# Patient Record
Sex: Female | Born: 1950
Health system: Southern US, Community
[De-identification: ages and names within clinical notes are randomized; demographics above are authoritative.]

## PROBLEM LIST (undated history)

## (undated) DIAGNOSIS — I10 Essential (primary) hypertension: Secondary | ICD-10-CM

## (undated) DIAGNOSIS — E785 Hyperlipidemia, unspecified: Secondary | ICD-10-CM

## (undated) DIAGNOSIS — R42 Dizziness and giddiness: Secondary | ICD-10-CM

## (undated) DIAGNOSIS — C539 Malignant neoplasm of cervix uteri, unspecified: Secondary | ICD-10-CM

## (undated) HISTORY — PX: ABDOMINAL HYSTERECTOMY: SHX81

## (undated) HISTORY — DX: Dizziness and giddiness: R42

## (undated) HISTORY — DX: Malignant neoplasm of cervix uteri, unspecified: C53.9

---

## 1998-12-16 ENCOUNTER — Other Ambulatory Visit: Admission: RE | Admit: 1998-12-16 | Discharge: 1998-12-16 | Payer: Self-pay | Admitting: Obstetrics and Gynecology

## 1999-02-05 ENCOUNTER — Encounter (INDEPENDENT_AMBULATORY_CARE_PROVIDER_SITE_OTHER): Payer: Self-pay

## 1999-02-05 ENCOUNTER — Other Ambulatory Visit: Admission: RE | Admit: 1999-02-05 | Discharge: 1999-02-05 | Payer: Self-pay | Admitting: Obstetrics and Gynecology

## 1999-07-27 ENCOUNTER — Other Ambulatory Visit: Admission: RE | Admit: 1999-07-27 | Discharge: 1999-07-27 | Payer: Self-pay | Admitting: Obstetrics and Gynecology

## 2000-05-30 ENCOUNTER — Encounter: Payer: Self-pay | Admitting: Internal Medicine

## 2000-05-30 ENCOUNTER — Encounter: Admission: RE | Admit: 2000-05-30 | Discharge: 2000-05-30 | Payer: Self-pay | Admitting: Internal Medicine

## 2000-06-10 ENCOUNTER — Ambulatory Visit (HOSPITAL_COMMUNITY): Admission: RE | Admit: 2000-06-10 | Discharge: 2000-06-10 | Payer: Self-pay | Admitting: Gastroenterology

## 2000-06-23 ENCOUNTER — Other Ambulatory Visit: Admission: RE | Admit: 2000-06-23 | Discharge: 2000-06-23 | Payer: Self-pay | Admitting: Internal Medicine

## 2000-08-08 ENCOUNTER — Encounter: Admission: RE | Admit: 2000-08-08 | Discharge: 2000-08-08 | Payer: Self-pay | Admitting: Internal Medicine

## 2000-08-08 ENCOUNTER — Encounter: Payer: Self-pay | Admitting: Internal Medicine

## 2001-05-25 ENCOUNTER — Encounter: Admission: RE | Admit: 2001-05-25 | Discharge: 2001-05-25 | Payer: Self-pay | Admitting: Internal Medicine

## 2001-05-25 ENCOUNTER — Encounter: Payer: Self-pay | Admitting: Internal Medicine

## 2002-06-22 ENCOUNTER — Encounter: Admission: RE | Admit: 2002-06-22 | Discharge: 2002-06-22 | Payer: Self-pay | Admitting: Internal Medicine

## 2002-06-22 ENCOUNTER — Encounter: Payer: Self-pay | Admitting: Internal Medicine

## 2003-04-03 ENCOUNTER — Emergency Department (HOSPITAL_COMMUNITY): Admission: EM | Admit: 2003-04-03 | Discharge: 2003-04-03 | Payer: Self-pay | Admitting: Emergency Medicine

## 2003-07-08 ENCOUNTER — Encounter: Admission: RE | Admit: 2003-07-08 | Discharge: 2003-07-08 | Payer: Self-pay | Admitting: Internal Medicine

## 2003-07-15 ENCOUNTER — Encounter: Admission: RE | Admit: 2003-07-15 | Discharge: 2003-07-15 | Payer: Self-pay | Admitting: Internal Medicine

## 2004-04-14 ENCOUNTER — Encounter: Admission: RE | Admit: 2004-04-14 | Discharge: 2004-04-14 | Payer: Self-pay | Admitting: Internal Medicine

## 2004-10-20 ENCOUNTER — Encounter: Admission: RE | Admit: 2004-10-20 | Discharge: 2004-10-20 | Payer: Self-pay | Admitting: Internal Medicine

## 2005-01-21 ENCOUNTER — Encounter: Admission: RE | Admit: 2005-01-21 | Discharge: 2005-01-21 | Payer: Self-pay | Admitting: General Surgery

## 2005-12-13 ENCOUNTER — Encounter: Admission: RE | Admit: 2005-12-13 | Discharge: 2005-12-13 | Payer: Self-pay | Admitting: Internal Medicine

## 2006-01-22 IMAGING — US US ABDOMEN COMPLETE
1 series · 14 of 25 positions shown · non-contrast
Comparison: none

CLINICAL DATA: Right side abdominal pain.
ABDOMEN ULTRASOUND:
TECHNIQUE: Complete abdominal ultrasound examination was performed including evaluation of the liver, gallbladder, bile ducts, pancreas, kidneys, spleen, IVC, and abdominal aorta.

[Series 1: us abdomen complete · 0.32mm/px · 14 of 78 slices shown]
[im 1/78]
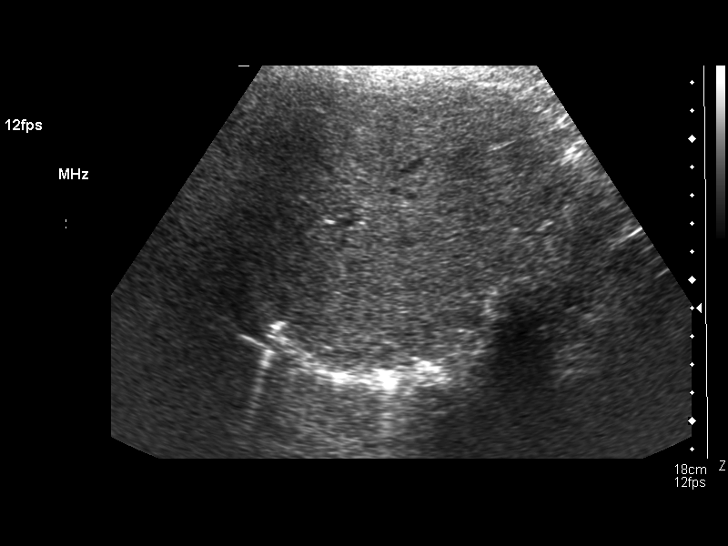
[im 7/78]
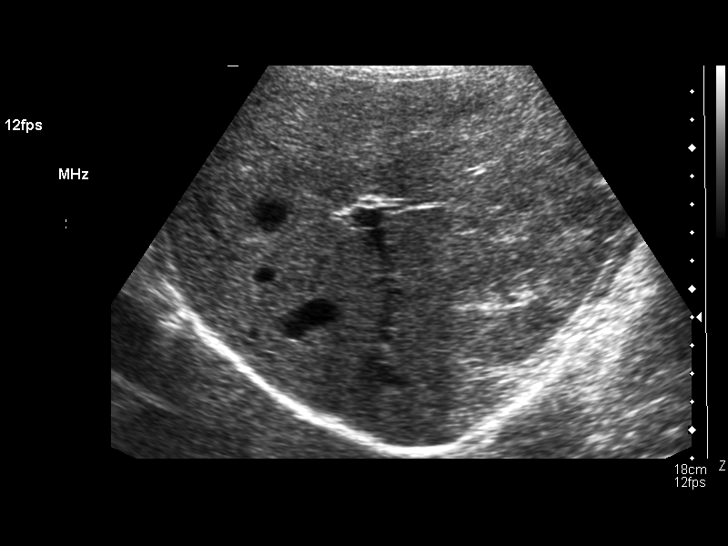
[im 13/78]
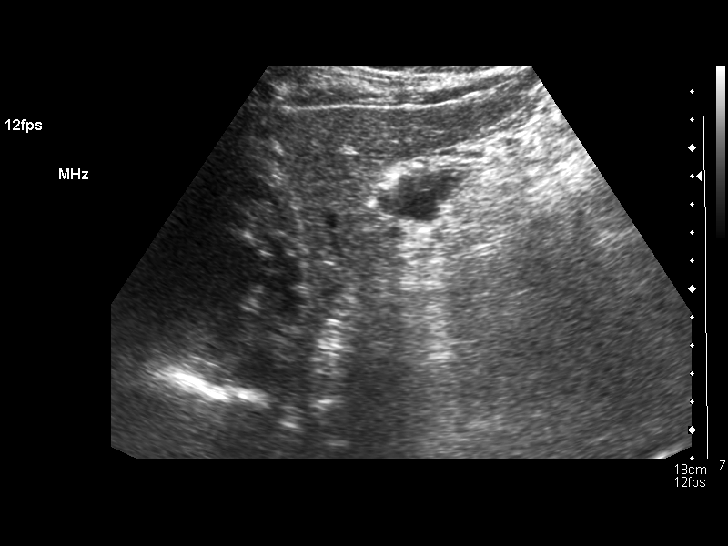
[im 20/78]
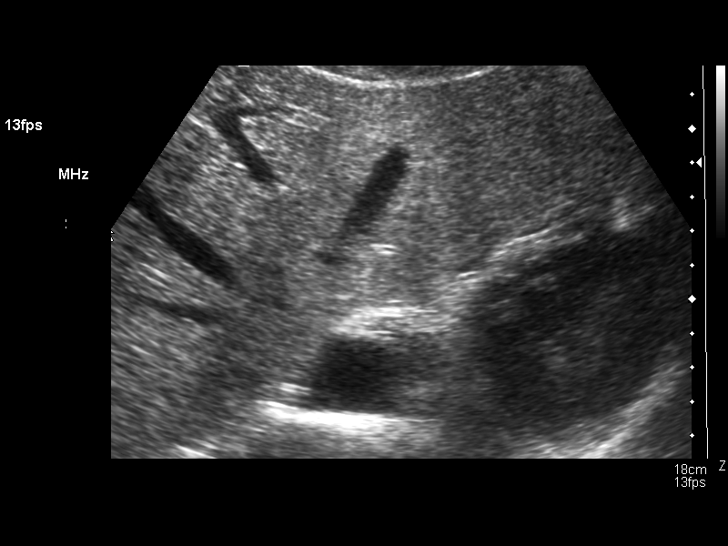
[im 26/78]
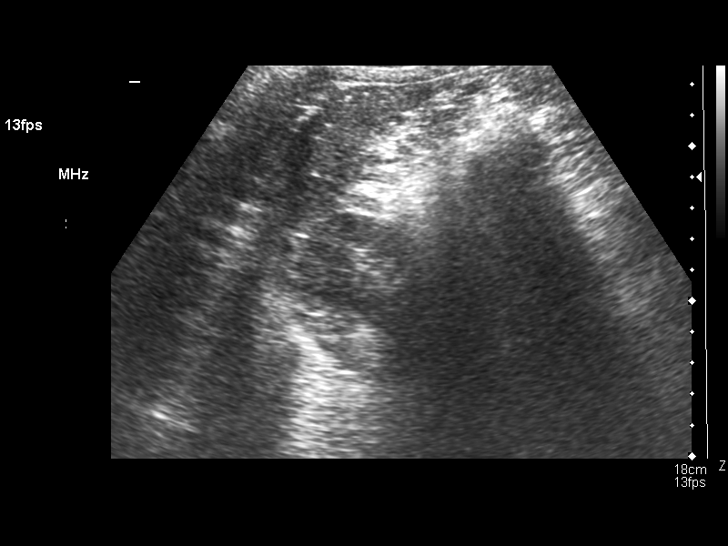
[im 29/78]
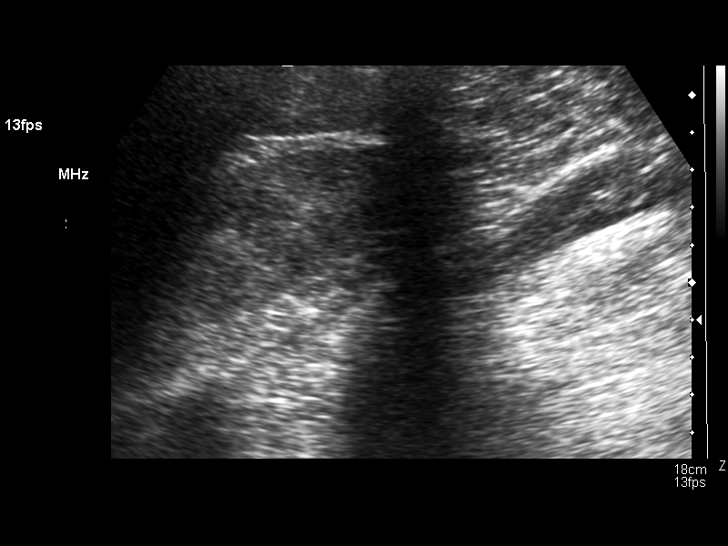
[im 36/78]
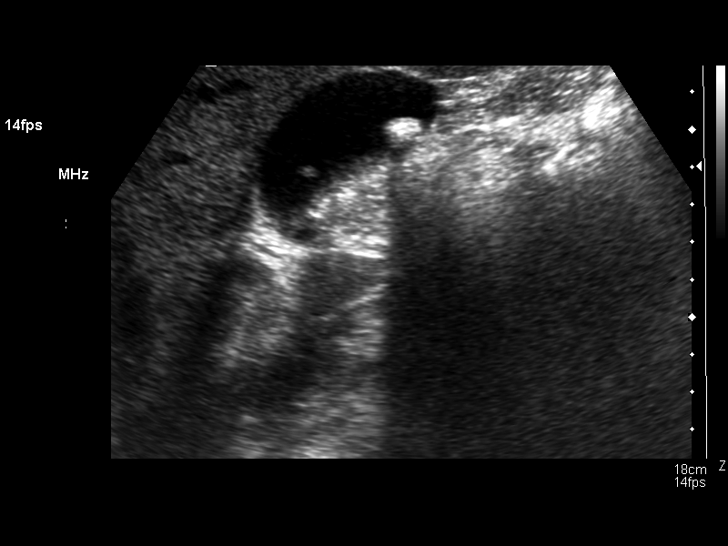
[im 42/78]
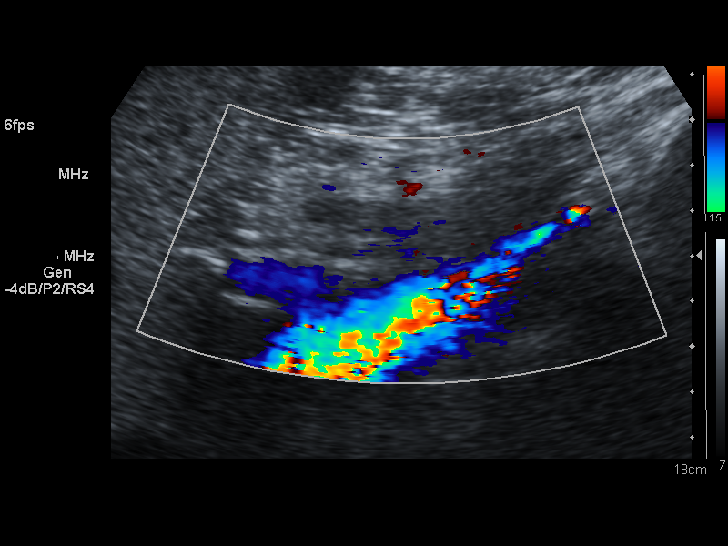
[im 49/78]
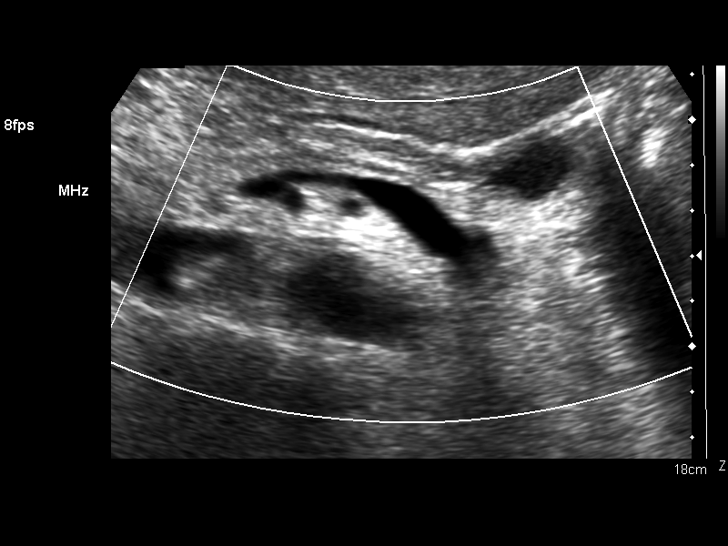
[im 52/78]
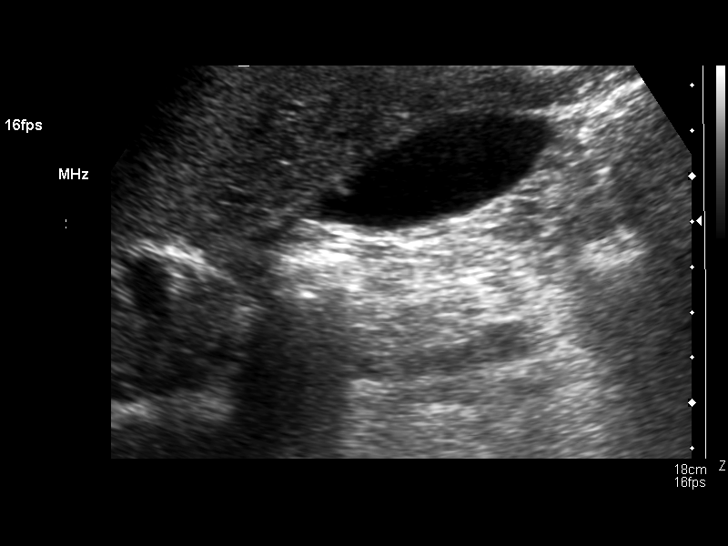
[im 58/78]
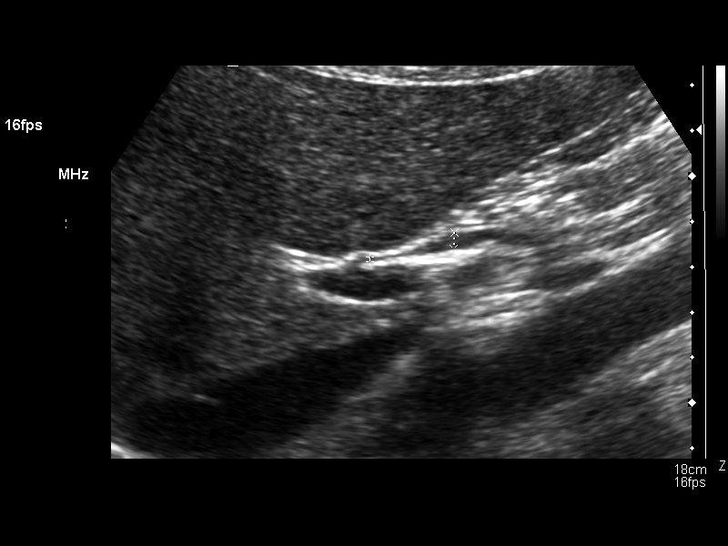
[im 65/78]
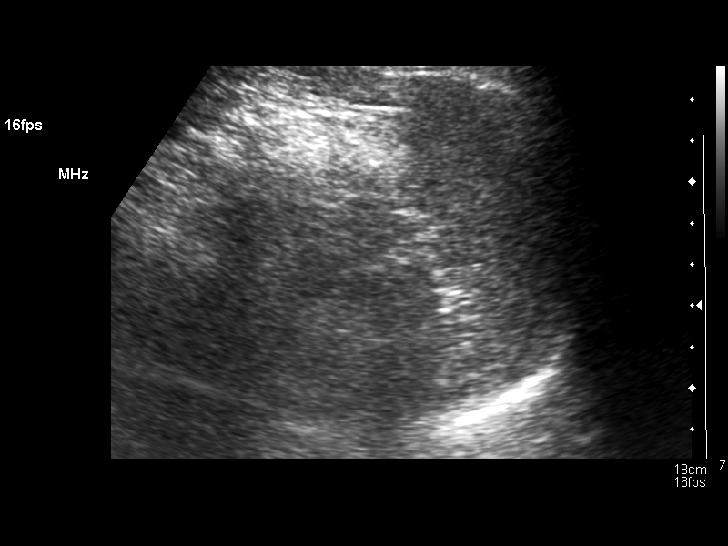
[im 71/78]
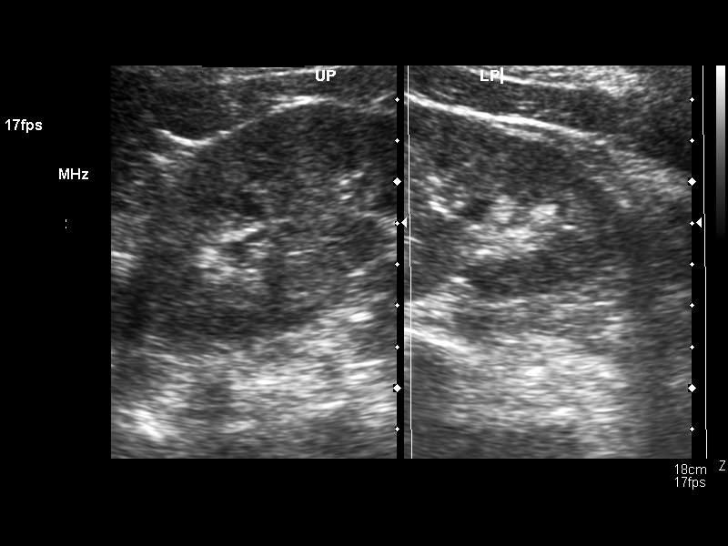
[im 78/78]
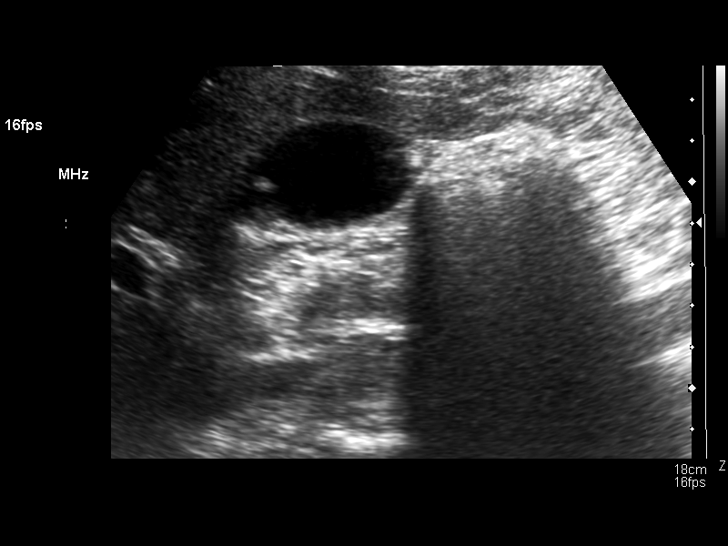

[14 of 25 positions shown; findings below may reference images not displayed]

FINDINGS: At least one gallstone is seen measuring 1 cm. Two other tiny echogenic foci are noted which are nonmobile and consistent with tiny cholesterol polyps. There is no evidence of gallbladder wall thickening. There is no evidence of biliary ductal dilatation with common bile duct measuring approximately 3 mm.
The liver is normal in echogenicity. A tiny cyst measuring 7 mm is seen in the posterior right hepatic lobe. No liver masses are identified.  Visualized portion of the IVC and pancreas are unremarkable. 
There is no evidence of splenomegaly.  Both kidneys are normal in size and appearance. There is no evidence of abdominal aortic aneurysm or ascites.
IMPRESSION: 1.  Cholelithiasis, without evidence of gallbladder wall thickening, biliary dilatation, or other acute findings
2.  Tiny 7 mm cyst  in the posterior right hepatic lobe.

## 2007-04-28 ENCOUNTER — Encounter: Admission: RE | Admit: 2007-04-28 | Discharge: 2007-04-28 | Payer: Self-pay | Admitting: Internal Medicine

## 2007-05-10 ENCOUNTER — Encounter: Admission: RE | Admit: 2007-05-10 | Discharge: 2007-05-10 | Payer: Self-pay | Admitting: Internal Medicine

## 2007-05-25 ENCOUNTER — Ambulatory Visit: Payer: Self-pay | Admitting: Family Medicine

## 2007-05-25 ENCOUNTER — Encounter: Admission: RE | Admit: 2007-05-25 | Discharge: 2007-05-25 | Payer: Self-pay | Admitting: Family Medicine

## 2007-05-26 ENCOUNTER — Encounter: Admission: RE | Admit: 2007-05-26 | Discharge: 2007-05-26 | Payer: Self-pay | Admitting: Family Medicine

## 2007-06-05 ENCOUNTER — Ambulatory Visit: Payer: Self-pay | Admitting: Family Medicine

## 2007-12-09 ENCOUNTER — Emergency Department (HOSPITAL_COMMUNITY): Admission: EM | Admit: 2007-12-09 | Discharge: 2007-12-09 | Payer: Self-pay | Admitting: Emergency Medicine

## 2007-12-10 ENCOUNTER — Inpatient Hospital Stay (HOSPITAL_COMMUNITY): Admission: EM | Admit: 2007-12-10 | Discharge: 2007-12-12 | Payer: Self-pay | Admitting: Orthopedic Surgery

## 2007-12-10 ENCOUNTER — Emergency Department (HOSPITAL_COMMUNITY): Admission: EM | Admit: 2007-12-10 | Discharge: 2007-12-10 | Payer: Self-pay | Admitting: Family Medicine

## 2008-03-04 ENCOUNTER — Ambulatory Visit: Payer: Self-pay | Admitting: Family Medicine

## 2008-03-04 ENCOUNTER — Emergency Department (HOSPITAL_COMMUNITY): Admission: EM | Admit: 2008-03-04 | Discharge: 2008-03-04 | Payer: Self-pay | Admitting: Emergency Medicine

## 2008-09-02 ENCOUNTER — Encounter: Admission: RE | Admit: 2008-09-02 | Discharge: 2008-09-02 | Payer: Self-pay | Admitting: Internal Medicine

## 2008-11-14 ENCOUNTER — Emergency Department (HOSPITAL_COMMUNITY): Admission: EM | Admit: 2008-11-14 | Discharge: 2008-11-14 | Payer: Self-pay | Admitting: Emergency Medicine

## 2008-11-17 ENCOUNTER — Emergency Department (HOSPITAL_COMMUNITY): Admission: EM | Admit: 2008-11-17 | Discharge: 2008-11-17 | Payer: Self-pay | Admitting: Family Medicine

## 2009-10-20 ENCOUNTER — Encounter: Admission: RE | Admit: 2009-10-20 | Discharge: 2009-10-20 | Payer: Self-pay | Admitting: Internal Medicine

## 2010-01-18 ENCOUNTER — Emergency Department (HOSPITAL_COMMUNITY): Admission: EM | Admit: 2010-01-18 | Discharge: 2010-01-18 | Payer: Self-pay | Admitting: Family Medicine

## 2010-04-19 ENCOUNTER — Encounter: Payer: Self-pay | Admitting: Internal Medicine

## 2010-07-04 LAB — CULTURE, ROUTINE-ABSCESS

## 2010-08-14 NOTE — Procedures (Signed)
[facility]  Patient:    Erika Mosley, Erika Mosley                      MRN: [id number] Proc. Date: 06/10/00 Adm. Date:  [id number] Attending:  Charna Elizabeth CC:         Merlene Laughter. Renae Gloss, M.D.   Procedure Report  DATE OF BIRTH:  [birth year].  PROCEDURE:  Colonoscopy.  ENDOSCOPIST:  Anselmo Rod, M.D.  INSTRUMENT USED:  Olympus video colonoscope.  INDICATION FOR PROCEDURE:  Rectal bleeding and a family history of colon cancer (in mother) in a 18 year old African-American female.  Rule out colonic polyps, masses, hemorrhoids, etc.  PREPROCEDURE PREPARATION:  Informed consent was procured from the patient. The patient was fasted for eight hours prior to the procedure and prepped with a bottle of magnesium citrate and a gallon of NuLytely the night prior to the procedure.  PREPROCEDURE PHYSICAL:  VITAL SIGNS:  The patient had stable vital signs.  NECK:  Supple.  CHEST:  Clear to auscultation.  S1, S2 regular.  ABDOMEN:  Soft with normal abdominal bowel sounds.  DESCRIPTION OF PROCEDURE:  The patient was placed in the left lateral decubitus position and sedated with 40 mg of Demerol and 4 mg of Versed intravenously.  Once the patient was adequately sedate and maintained on low-flow oxygen and continuous cardiac monitoring, the Olympus video colonoscope was advanced from the rectum to the cecum with slight difficulty secondary to residual stool in the colon.  The ileocecal valve and the appendiceal orifice were clearly visualized and photographed.  No masses, polyps, erosions, ulcerations, or masses were seen.  There were small external and internal hemorrhoids seen on anal inspection and retroflexion, respectively.  The patient tolerated the procedure well without complications. Very small lesion may have been missed secondary to some residual stool in the transverse and right colon.  IMPRESSION: 1. Small internal and external  hemorrhoid. 2. No masses or polyps seen. 3. Some residual stool in the colon.  A very small lesion may have been missed.  RECOMMENDATIONS: 1. Patient has been advised to try Anusol-HC 2.5% suppositories, to be    inserted one every night at bedtime to provide her some relief from    internal hemorrhoids.  Preparation H is to be applied externally. 2. High-fiber diet with liberal fluid intake has been advocated. 3. Outpatient follow-up is advised in the next two to four weeks. 4. If the bleeding does not stop with local steroid application, surgical    evaluation will be considered. 5. Considering the patients family history of colon cancer in a first    degree relative, repeat colorectal cancer screening has been recommended    in the next five years unless she were to develop any abnormal symptoms    in the interim. DD:  06/10/00 TD:  06/10/00 Job: [id number]

## 2010-11-02 ENCOUNTER — Other Ambulatory Visit: Payer: Self-pay | Admitting: Internal Medicine

## 2010-11-03 ENCOUNTER — Other Ambulatory Visit: Payer: Self-pay | Admitting: Family Medicine

## 2010-11-03 DIAGNOSIS — Z1231 Encounter for screening mammogram for malignant neoplasm of breast: Secondary | ICD-10-CM

## 2010-11-10 ENCOUNTER — Ambulatory Visit
Admission: RE | Admit: 2010-11-10 | Discharge: 2010-11-10 | Disposition: A | Discharge status: Home / Self Care | Payer: 59 | Source: Ambulatory Visit | Attending: Family Medicine | Admitting: Family Medicine

## 2010-11-10 DIAGNOSIS — Z1231 Encounter for screening mammogram for malignant neoplasm of breast: Secondary | ICD-10-CM

## 2010-12-30 LAB — CBC
MCHC: 33.5
RBC: 4.18

## 2010-12-30 LAB — DIFFERENTIAL
Basophils Absolute: 0
Basophils Relative: 0
Lymphocytes Relative: 28
Monocytes Relative: 7
Neutro Abs: 6.1
Neutrophils Relative %: 64

## 2010-12-30 LAB — BASIC METABOLIC PANEL
Chloride: 106
GFR calc non Af Amer: >60
Glucose, Bld: 96
Potassium: 3.4 — ABNORMAL LOW
Sodium: 140

## 2011-05-31 ENCOUNTER — Emergency Department (INDEPENDENT_AMBULATORY_CARE_PROVIDER_SITE_OTHER)
Admission: EM | Admit: 2011-05-31 | Discharge: 2011-05-31 | Disposition: A | Discharge status: Home Health | Payer: 59 | Source: Home / Self Care | Attending: Emergency Medicine | Admitting: Emergency Medicine

## 2011-05-31 ENCOUNTER — Encounter (HOSPITAL_COMMUNITY): Payer: Self-pay | Admitting: Emergency Medicine

## 2011-05-31 DIAGNOSIS — S335XXA Sprain of ligaments of lumbar spine, initial encounter: Secondary | ICD-10-CM

## 2011-05-31 DIAGNOSIS — S39012A Strain of muscle, fascia and tendon of lower back, initial encounter: Secondary | ICD-10-CM

## 2011-05-31 HISTORY — DX: Hyperlipidemia, unspecified: E78.5

## 2011-05-31 HISTORY — DX: Essential (primary) hypertension: I10

## 2011-05-31 LAB — POCT URINALYSIS DIP (DEVICE)
Glucose, UA: NEGATIVE mg/dL
Ketones, ur: NEGATIVE mg/dL
Specific Gravity, Urine: 1.015 (ref 1.005–1.030)

## 2011-05-31 MED ORDER — METHOCARBAMOL 750 MG PO TABS: 750.0000 mg | ORAL_TABLET | Freq: Four times a day (QID) | ORAL | Status: AC

## 2011-05-31 MED ORDER — DICLOFENAC SODIUM 75 MG PO TBEC: 75.0000 mg | DELAYED_RELEASE_TABLET | Freq: Two times a day (BID) | ORAL | Status: DC

## 2011-05-31 NOTE — ED Provider Notes (Signed)
History     CSN: [id number]  Arrival date & time 05/31/11  8119   First MD Initiated Contact with Patient 05/31/11 [phone]      Chief Complaint  Patient presents with  . Flank Pain    (Consider location/radiation/quality/duration/timing/severity/associated sxs/prior treatment) HPI Comments: Patient presents today with complaints of right lower back pain. Pain is in her right flank pain area and does not radiate. Pain began suddenly 2 days ago while she was carrying her grandchild. She states she bent forward to put her grandchild down and suddenly had sharp pain. She has had syncope previously over the last year while doing heavy lifting. She has been seen with previous episodes either by her primary care Dr or at another urgent care, she is uncertain and treated with steroids which did help. She thinks that she may have also had x-rays of her back done but again is uncertain.    Past Medical History  Diagnosis Date  . Hypertension   . Hyperlipidemia     Past Surgical History  Procedure Date  . Abdominal hysterectomy     History reviewed. No pertinent family history.  History  Substance Use Topics  . Smoking status: Never Smoker   . Smokeless tobacco: Not on file  . Alcohol Use: No    OB History    Grav Para Term Preterm Abortions TAB SAB Ect Mult Living                  Review of Systems  Constitutional: Negative for fever and chills.  Respiratory: Negative for cough.   Cardiovascular: Negative for chest pain.  Gastrointestinal: Negative for vomiting, abdominal pain and diarrhea.  Genitourinary: Negative for dysuria and frequency.  Musculoskeletal: Positive for back pain.  Neurological: Negative for weakness and numbness.    Allergies  Review of patient's allergies indicates no known allergies.  Home Medications   Current Outpatient Rx  Name Route Sig Dispense Refill  . ASPIRIN 81 MG PO TABS Oral Take 81 mg by mouth daily.    Marland Kitchen LISINOPRIL 5 MG PO TABS Oral  Take 5 mg by mouth daily.    Marland Kitchen SIMVASTATIN 10 MG PO TABS Oral Take 10 mg by mouth at bedtime.    Marland Kitchen DICLOFENAC SODIUM 75 MG PO TBEC Oral Take 1 tablet (75 mg total) by mouth 2 (two) times daily. 20 tablet 0  . METHOCARBAMOL 750 MG PO TABS Oral Take 1 tablet (750 mg total) by mouth 4 (four) times daily. 20 tablet 0    BP 156/75  Pulse 74  Temp(Src) 98.3 F (36.8 C) (Oral)  Resp 20  SpO2 98%  Physical Exam  Nursing note and vitals reviewed. Constitutional: She appears well-developed and well-nourished. No distress.  HENT:  Head: Normocephalic and atraumatic.  Cardiovascular: Normal rate, regular rhythm and normal heart sounds.   Pulses:      Dorsalis pedis pulses are 2+ on the right side, and 2+ on the left side.       Posterior tibial pulses are 2+ on the right side, and 2+ on the left side.       No PTE bilat  Pulmonary/Chest: Effort normal and breath sounds normal. No respiratory distress.  Abdominal: Soft. Bowel sounds are normal. She exhibits no distension and no mass. There is no tenderness.  Musculoskeletal:       Lumbar back: She exhibits decreased range of motion (decreased flexion at 45 degrees due to pain). She exhibits no tenderness, no bony tenderness, no  swelling, no deformity and no spasm.       Neg SLR bilat. Pt able to stand heels and toes.  Pain reproduced with twisting movement of torso, flexion of lumbar spine and movment on exam table.   Neurological: She has normal strength. Gait normal.  Reflex Scores:      Achilles reflexes are 2+ on the right side and 2+ on the left side. Skin: Skin is warm and dry.  Psychiatric: She has a normal mood and affect.    ED Course  Procedures (including critical care time)  Labs Reviewed  POCT URINALYSIS DIP (DEVICE) - Abnormal; Notable for the following:    Hgb urine dipstick TRACE (*)    All other components within normal limits   No results found.   1. Strain of lumbar region       MDM  Sudden onset of Rt LBP  while carrying grandchild 2 days ago and bending forward. Hx of same recurrent pain with heavy lifting x 1 yr per pt treated previously at other facilities with Prednisone.         Melody Comas, [place] 05/31/11 1211

## 2011-05-31 NOTE — ED Notes (Signed)
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

## 2011-05-31 NOTE — Discharge Instructions (Signed)
Continue ice packs 3-4 times a day for 15-20 minutes. Follow up with Dr Yehuda Budd. As we discussed, since this is a recurrent problem, she may wish to consider physical therapy evaluation for you to help strengthen your core. This will be for her to determine at your follow up evaluation. Return as needed.

## 2011-06-01 NOTE — ED Provider Notes (Signed)
Medical screening examination/treatment/procedure(s) were performed by non-physician practitioner and as supervising physician I was immediately available for consultation/collaboration.  Leslee Home, M.D.   Roque Lias, MD 06/01/11 [phone]

## 2011-09-09 ENCOUNTER — Other Ambulatory Visit: Payer: Self-pay | Admitting: Family Medicine

## 2011-09-09 DIAGNOSIS — Z1231 Encounter for screening mammogram for malignant neoplasm of breast: Secondary | ICD-10-CM

## 2011-09-14 ENCOUNTER — Ambulatory Visit
Admission: RE | Admit: 2011-09-14 | Discharge: 2011-09-14 | Disposition: A | Discharge status: Home / Self Care | Payer: Self-pay | Source: Ambulatory Visit | Attending: Family Medicine | Admitting: Family Medicine

## 2011-09-14 DIAGNOSIS — Z1231 Encounter for screening mammogram for malignant neoplasm of breast: Secondary | ICD-10-CM

## 2011-11-24 ENCOUNTER — Ambulatory Visit: Admission: RE | Admit: 2011-11-24 | Payer: 59 | Source: Ambulatory Visit

## 2011-11-24 ENCOUNTER — Ambulatory Visit
Admission: RE | Admit: 2011-11-24 | Discharge: 2011-11-24 | Disposition: A | Discharge status: Home / Self Care | Payer: 59 | Source: Ambulatory Visit | Attending: Family Medicine | Admitting: Family Medicine

## 2011-11-24 ENCOUNTER — Other Ambulatory Visit: Payer: Self-pay | Admitting: Family Medicine

## 2011-11-24 DIAGNOSIS — Z1231 Encounter for screening mammogram for malignant neoplasm of breast: Secondary | ICD-10-CM

## 2012-05-17 ENCOUNTER — Emergency Department (HOSPITAL_COMMUNITY)
Admission: EM | Admit: 2012-05-17 | Discharge: 2012-05-17 | Disposition: A | Discharge status: Home / Self Care | Payer: 59 | Attending: Emergency Medicine | Admitting: Emergency Medicine

## 2012-05-17 ENCOUNTER — Emergency Department (HOSPITAL_COMMUNITY): Payer: 59

## 2012-05-17 ENCOUNTER — Encounter (HOSPITAL_COMMUNITY): Payer: Self-pay | Admitting: Family Medicine

## 2012-05-17 DIAGNOSIS — E785 Hyperlipidemia, unspecified: Secondary | ICD-10-CM | POA: Insufficient documentation

## 2012-05-17 DIAGNOSIS — J019 Acute sinusitis, unspecified: Secondary | ICD-10-CM | POA: Insufficient documentation

## 2012-05-17 DIAGNOSIS — Z79899 Other long term (current) drug therapy: Secondary | ICD-10-CM | POA: Insufficient documentation

## 2012-05-17 DIAGNOSIS — R110 Nausea: Secondary | ICD-10-CM | POA: Insufficient documentation

## 2012-05-17 DIAGNOSIS — I10 Essential (primary) hypertension: Secondary | ICD-10-CM | POA: Insufficient documentation

## 2012-05-17 DIAGNOSIS — Z7982 Long term (current) use of aspirin: Secondary | ICD-10-CM | POA: Insufficient documentation

## 2012-05-17 LAB — POCT I-STAT, CHEM 8
BUN: 11 mg/dL (ref 6–23)
Chloride: 105 mEq/L (ref 96–112)
Sodium: 142 mEq/L (ref 135–145)

## 2012-05-17 LAB — POCT I-STAT TROPONIN I: Troponin i, poc: 0 ng/mL (ref 0.00–0.08)

## 2012-05-17 LAB — GLUCOSE, CAPILLARY: Glucose-Capillary: 82 mg/dL (ref 70–99)

## 2012-05-17 MED ORDER — MECLIZINE HCL 12.5 MG PO TABS: 12.5000 mg | ORAL_TABLET | Freq: Three times a day (TID) | ORAL | Status: DC | PRN

## 2012-05-17 MED ORDER — LEVOFLOXACIN 500 MG PO TABS: 500.0000 mg | ORAL_TABLET | Freq: Every day | ORAL | Status: DC

## 2012-05-17 MED ORDER — MECLIZINE HCL 25 MG PO TABS
25.0000 mg | ORAL_TABLET | Freq: Once | ORAL | Status: AC
  Administered 2012-05-17: 25 mg via ORAL
  Filled 2012-05-17: qty 1

## 2012-05-17 NOTE — ED Provider Notes (Addendum)
History     CSN: [id number]  Arrival date & time 05/17/12  0309   First MD Initiated Contact with Patient 05/17/12 [phone]      Chief Complaint  Patient presents with  . Dizziness  . Nausea    (Consider location/radiation/quality/duration/timing/severity/associated sxs/prior treatment) HPI Comments: PT states had vertigo x .  Resolved now.  NO pain, no headache,  Some nausea, resolved.  Staets has had hx of recurrent vertigo, and that this feels similar.  The history is provided by the patient.    Past Medical History  Diagnosis Date  . Hypertension   . Hyperlipidemia     Past Surgical History  Procedure Laterality Date  . Abdominal hysterectomy      History reviewed. No pertinent family history.  History  Substance Use Topics  . Smoking status: Never Smoker   . Smokeless tobacco: Not on file  . Alcohol Use: Yes     Comment: Occassionally - less than qmonthly    OB History   Grav Para Term Preterm Abortions TAB SAB Ect Mult Living                  Review of Systems  Gastrointestinal: Positive for nausea.  Neurological: Positive for dizziness.  All other systems reviewed and are negative.    Allergies  Review of patient's allergies indicates no known allergies.  Home Medications   Current Outpatient Rx  Name  Route  Sig  Dispense  Refill  . aspirin 81 MG tablet   Oral   Take 81 mg by mouth daily.         . diclofenac (VOLTAREN) 75 MG EC tablet   Oral   Take 1 tablet (75 mg total) by mouth 2 (two) times daily.   20 tablet   0   . lisinopril (PRINIVIL,ZESTRIL) 5 MG tablet   Oral   Take 5 mg by mouth daily.         . simvastatin (ZOCOR) 10 MG tablet   Oral   Take 10 mg by mouth at bedtime.           BP 106/47  Pulse 72  Temp(Src) 98.8 F (37.1 C) (Oral)  Resp 18  SpO2 100%  Physical Exam  Nursing note and vitals reviewed. Constitutional: She is oriented to person, place, and time. She appears well-developed and  well-nourished.  HENT:  Head: Normocephalic and atraumatic.  Eyes: Conjunctivae and EOM are normal. Pupils are equal, round, and reactive to light.  Neck: Normal range of motion.  Cardiovascular: Normal rate, regular rhythm and normal heart sounds.   Pulmonary/Chest: Effort normal and breath sounds normal.  Abdominal: Soft. Bowel sounds are normal.  Musculoskeletal: Normal range of motion.  Neurological: She is alert and oriented to person, place, and time.  Skin: Skin is warm and dry.  Psychiatric: She has a normal mood and affect. Her behavior is normal.    ED Course  Procedures (including critical care time)  Labs Reviewed - No data to display No results found.   No diagnosis found.    Date: 05/17/2012  Rate: 73  Rhythm: normal sinus rhythm  QRS Axis: normal  Intervals: normal  ST/T Wave abnormalities: normal  Conduction Disutrbances: none  Narrative Interpretation: unremarkable    MDM  Symptoms improved.  No focal deficits.  Will lab, ct, reassess    Mild sinusitis.  Will treat,  Dc to fu,  Ret new/worsening sxs    Mary-Anne Polizzi Lytle Michaels, MD 05/17/12 [phone]  Xan Sparkman Lytle Michaels, MD 05/17/12 [phone]

## 2012-05-17 NOTE — ED Notes (Signed)
Per EMS pt was at work, went on break and felt "bad", per pt has had "cold symptoms" for 2-3 days. Pt went to bathroom and became dizzy and nauseous while bearing down. Per EMs pt was diaphoretic, nauseous, and had light sensitivity on their arrival. EMS gave 4mg  Zofran IV. Per EMS pt was NSR. Pt alert and interactive at this time, continues to be light sensitive, states feels less dizzy and less nauseous.

## 2012-09-29 ENCOUNTER — Emergency Department (HOSPITAL_COMMUNITY)
Admission: EM | Admit: 2012-09-29 | Discharge: 2012-09-29 | Disposition: A | Discharge status: Home / Self Care | Payer: 59 | Source: Home / Self Care | Attending: Emergency Medicine | Admitting: Emergency Medicine

## 2012-09-29 ENCOUNTER — Emergency Department (INDEPENDENT_AMBULATORY_CARE_PROVIDER_SITE_OTHER): Payer: 59

## 2012-09-29 ENCOUNTER — Encounter (HOSPITAL_COMMUNITY): Payer: Self-pay | Admitting: *Deleted

## 2012-09-29 DIAGNOSIS — M549 Dorsalgia, unspecified: Secondary | ICD-10-CM

## 2012-09-29 DIAGNOSIS — R51 Headache: Secondary | ICD-10-CM

## 2012-09-29 LAB — CBC WITH DIFFERENTIAL/PLATELET
Basophils Relative: 0 % (ref 0–1)
Eosinophils Absolute: 0 10*3/uL (ref 0.0–0.7)
HCT: 39.7 % (ref 36.0–46.0)
Hemoglobin: 13.1 g/dL (ref 12.0–15.0)
Lymphs Abs: 2.1 10*3/uL (ref 0.7–4.0)
MCH: 30 pg (ref 26.0–34.0)
MCHC: 33 g/dL (ref 30.0–36.0)
MCV: 90.8 fL (ref 78.0–100.0)
Monocytes Absolute: 1.1 10*3/uL — ABNORMAL HIGH (ref 0.1–1.0)
Monocytes Relative: 9 % (ref 3–12)
Neutrophils Relative %: 75 % (ref 43–77)
RBC: 4.37 MIL/uL (ref 3.87–5.11)

## 2012-09-29 LAB — POCT I-STAT, CHEM 8
Creatinine, Ser: 0.8 mg/dL (ref 0.50–1.10)
Glucose, Bld: 103 mg/dL — ABNORMAL HIGH (ref 70–99)
HCT: 42 % (ref 36.0–46.0)
Hemoglobin: 14.3 g/dL (ref 12.0–15.0)
Sodium: 141 mEq/L (ref 135–145)
TCO2: 26 mmol/L (ref 0–100)

## 2012-09-29 LAB — POCT URINALYSIS DIP (DEVICE)
Bilirubin Urine: NEGATIVE
Glucose, UA: NEGATIVE mg/dL
Hgb urine dipstick: NEGATIVE
Ketones, ur: NEGATIVE mg/dL
Specific Gravity, Urine: 1.015 (ref 1.005–1.030)
pH: 7 (ref 5.0–8.0)

## 2012-09-29 MED ORDER — PREDNISONE 50 MG PO TABS: ORAL_TABLET | ORAL | Status: DC

## 2012-09-29 MED ORDER — BACLOFEN 10 MG PO TABS: 20.0000 mg | ORAL_TABLET | Freq: Three times a day (TID) | ORAL | Status: DC

## 2012-09-29 MED ORDER — ONDANSETRON 4 MG PO TBDP: 4.0000 mg | ORAL_TABLET | Freq: Four times a day (QID) | ORAL | Status: DC | PRN

## 2012-09-29 MED ORDER — HYDROCODONE-ACETAMINOPHEN 5-325 MG PO TABS: 1.0000 | ORAL_TABLET | ORAL | Status: DC | PRN

## 2012-09-29 NOTE — ED Notes (Addendum)
Pt  Reports      Pain r  Flank  As  Well  As   Some  Nausea   And  A  headache     No  Vomiting         denys  Any  Injury       Pt  Reports     Symptoms  Began  sev  Days     Ago           She  Reports  The  Pain in  Her  Headache is   described  As    A  Pressure    She  Is  Awake  And  Alert       She  Seems  Slightly  Restless

## 2012-09-29 NOTE — ED Provider Notes (Signed)
History    CSN: [id number] Arrival date & time 09/29/12  1431  None    Chief Complaint  Patient presents with  . Back Pain   (Consider location/radiation/quality/duration/timing/severity/associated sxs/prior Treatment) HPI Comments: 62 year old female presents complaining of flank pain that radiates around to the abdomen on the right side, ear pain, ringing in her ears, headache, nausea. This began with the pain around her right ear. She feels like the pain starts in front of her right ear and has moved up to the top of her head. She also occasionally has some shooting pain in the same distribution. She denies any blurry vision, ringing in ears. She tried taking meclizine for this because she thought it was vertigo but this did not help. About 3 days after the headache started, she started to have pain in her right flank that has gotten progressively worse and radiates around to the lower abdomen. This pain is intermittent as well. She has not taken anything for it because she states she doesn't know what she should take. She has had some nausea associated with this. She has a history of kidney stones but she does not think this feels like a kidney stone. She denies fever, hematuria, dysuria, vomiting, diarrhea.  Patient is a 62 y.o. female presenting with back pain.  Back Pain Associated symptoms: headaches   Associated symptoms: no abdominal pain, no chest pain, no dysuria, no fever, no numbness, no pelvic pain and no weakness    Past Medical History  Diagnosis Date  . Hypertension   . Hyperlipidemia    Past Surgical History  Procedure Laterality Date  . Abdominal hysterectomy     History reviewed. No pertinent family history. History  Substance Use Topics  . Smoking status: Never Smoker   . Smokeless tobacco: Not on file  . Alcohol Use: Yes     Comment: Occassionally - less than qmonthly   OB History   Grav Para Term Preterm Abortions TAB SAB Ect Mult Living                  Review of Systems  Constitutional: Negative for fever, chills, activity change and appetite change.  HENT: Positive for tinnitus. Negative for hearing loss, congestion, sore throat, mouth sores, trouble swallowing, neck pain, neck stiffness, dental problem and sinus pressure.   Eyes: Negative for visual disturbance.  Respiratory: Negative for cough and shortness of breath.   Cardiovascular: Negative for chest pain, palpitations and leg swelling.  Gastrointestinal: Positive for nausea. Negative for vomiting, abdominal pain, diarrhea, constipation, blood in stool and rectal pain.  Endocrine: Negative for polydipsia and polyuria.  Genitourinary: Positive for flank pain. Negative for dysuria, urgency, frequency, hematuria, vaginal discharge, vaginal pain and pelvic pain.  Musculoskeletal: Positive for back pain. Negative for myalgias, arthralgias and gait problem.  Skin: Negative for rash.  Neurological: Positive for headaches. Negative for dizziness, seizures, syncope, weakness, light-headedness and numbness.    Allergies  Review of patient's allergies indicates no known allergies.  Home Medications   Current Outpatient Rx  Name  Route  Sig  Dispense  Refill  . baclofen (LIORESAL) 10 MG tablet   Oral   Take 2 tablets (20 mg total) by mouth 3 (three) times daily.   30 each   0   . HYDROcodone-acetaminophen (NORCO/VICODIN) 5-325 MG per tablet   Oral   Take 1 tablet by mouth every 4 (four) hours as needed for pain.   15 tablet   0   .  levofloxacin (LEVAQUIN) 500 MG tablet   Oral   Take 1 tablet (500 mg total) by mouth daily.   7 tablet   0   . lisinopril (PRINIVIL,ZESTRIL) 5 MG tablet   Oral   Take 5 mg by mouth daily.         . meclizine (ANTIVERT) 12.5 MG tablet   Oral   Take 1 tablet (12.5 mg total) by mouth 3 (three) times daily as needed.   30 tablet   0   . ondansetron (ZOFRAN-ODT) 4 MG disintegrating tablet   Oral   Take 1 tablet (4 mg total) by mouth every 6  (six) hours as needed for nausea. PRN for nausea or vomiting   12 tablet   0   . predniSONE (DELTASONE) 50 MG tablet      1 tab by mouth daily   3 tablet   0   . simvastatin (ZOCOR) 10 MG tablet   Oral   Take 10 mg by mouth at bedtime.          BP 115/46  Pulse 93  Temp(Src) 100.1 F (37.8 C) (Oral)  Resp 16  SpO2 98% Physical Exam  Nursing note and vitals reviewed. Constitutional: She is oriented to person, place, and time. Vital signs are normal. She appears well-developed and well-nourished. No distress.  HENT:  Head: Atraumatic.  Eyes: EOM are normal. Pupils are equal, round, and reactive to light.  Fundoscopic exam:      The right eye shows no hemorrhage and no papilledema.       The left eye shows no hemorrhage and no papilledema.  Cardiovascular: Normal rate, regular rhythm and normal heart sounds.  Exam reveals no gallop and no friction rub.   No murmur heard. Pulmonary/Chest: Effort normal and breath sounds normal. No respiratory distress. She has no wheezes. She has no rales.  Abdominal: Soft. There is no hepatosplenomegaly. There is no tenderness. There is no rigidity, no rebound, no guarding, no CVA tenderness, no tenderness at McBurney's point and negative Murphy's sign.  Neurological: She is alert and oriented to person, place, and time. She has normal strength.  Skin: Skin is warm and dry. She is not diaphoretic.  Psychiatric: She has a normal mood and affect. Her behavior is normal. Judgment normal.    ED Course  Procedures (including critical care time) Labs Reviewed  CBC WITH DIFFERENTIAL - Abnormal; Notable for the following:    WBC 12.6 (*)    Neutro Abs 9.4 (*)    Monocytes Absolute 1.1 (*)    All other components within normal limits  SEDIMENTATION RATE - Abnormal; Notable for the following:    Sed Rate 26 (*)    All other components within normal limits  POCT URINALYSIS DIP (DEVICE) - Abnormal; Notable for the following:    Protein, ur 30  (*)    All other components within normal limits  POCT I-STAT, CHEM 8 - Abnormal; Notable for the following:    Glucose, Bld 103 (*)    Calcium, Ion 1.11 (*)    All other components within normal limits  URINE CULTURE   Dg Abd 1 View  09/29/2012   *RADIOLOGY REPORT*  Clinical Data: Right flank pain.  ABDOMEN - 1 VIEW  Comparison: [facility] image for abdominal CT scan dated 05/26/2007  Findings: Bowel gas pattern is normal.  No evidence of renal or ureteral calculi.  Stable phlebolith in the right side of the pelvis.  Stable bone island in the left  ilium.  IMPRESSION: Benign-appearing abdomen.   Original Report Authenticated By: Francene Boyers, M.D.   1. Back pain   2. Headache     MDM  Unclear what the exact cause of her symptoms this. The differential ranges from simple tension headache, migraine, trigeminal neuralgia, TMJ arthritis, ear infection, sinus infection. The back pain maybe musculoskeletal or may be related to another kidney stone. We'll treat her symptoms and have her followup in 2 days for a repeat check.   Meds ordered this encounter  Medications  . HYDROcodone-acetaminophen (NORCO/VICODIN) 5-325 MG per tablet    Sig: Take 1 tablet by mouth every 4 (four) hours as needed for pain.    Dispense:  15 tablet    Refill:  0  . baclofen (LIORESAL) 10 MG tablet    Sig: Take 2 tablets (20 mg total) by mouth 3 (three) times daily.    Dispense:  30 each    Refill:  0  . predniSONE (DELTASONE) 50 MG tablet    Sig: 1 tab by mouth daily    Dispense:  3 tablet    Refill:  0  . ondansetron (ZOFRAN-ODT) 4 MG disintegrating tablet    Sig: Take 1 tablet (4 mg total) by mouth every 6 (six) hours as needed for nausea. PRN for nausea or vomiting    Dispense:  12 tablet    Refill:  0     Graylon Good, PA-C 09/29/12 1826

## 2012-09-29 NOTE — ED Provider Notes (Signed)
Medical screening examination/treatment/procedure(s) were performed by non-physician practitioner and as supervising physician I was immediately available for consultation/collaboration.  Leslee Home, M.D.  Reuben Likes, MD 09/29/12 [phone]

## 2012-09-29 NOTE — Discharge Instructions (Signed)
Back Pain, Adult  Low back pain is very common. About 1 in 5 people have back pain. The cause of low back pain is rarely dangerous. The pain often gets better over time. About half of people with a sudden onset of back pain feel better in just 2 weeks. About 8 in 10 people feel better by 6 weeks.   CAUSES  Some common causes of back pain include:  · Strain of the muscles or ligaments supporting the spine.  · Wear and tear (degeneration) of the spinal discs.  · Arthritis.  · Direct injury to the back.  DIAGNOSIS  Most of the time, the direct cause of low back pain is not known. However, back pain can be treated effectively even when the exact cause of the pain is unknown. Answering your caregiver's questions about your overall health and symptoms is one of the most accurate ways to make sure the cause of your pain is not dangerous. If your caregiver needs more information, he or she may order lab work or imaging tests (X-rays or MRIs). However, even if imaging tests show changes in your back, this usually does not require surgery.  HOME CARE INSTRUCTIONS  For many people, back pain returns. Since low back pain is rarely dangerous, it is often a condition that people can learn to manage on their own.   · Remain active. It is stressful on the back to sit or stand in one place. Do not sit, drive, or stand in one place for more than 30 minutes at a time. Take short walks on level surfaces as soon as pain allows. Try to increase the length of time you walk each day.  · Do not stay in bed. Resting more than 1 or 2 days can delay your recovery.  · Do not avoid exercise or work. Your body is made to move. It is not dangerous to be active, even though your back may hurt. Your back will likely heal faster if you return to being active before your pain is gone.  · Pay attention to your body when you  bend and lift. Many people have less discomfort when lifting if they bend their knees, keep the load close to their bodies, and  avoid twisting. Often, the most comfortable positions are those that put less stress on your recovering back.  · Find a comfortable position to sleep. Use a firm mattress and lie on your side with your knees slightly bent. If you lie on your back, put a pillow under your knees.  · Only take over-the-counter or prescription medicines as directed by your caregiver. Over-the-counter medicines to reduce pain and inflammation are often the most helpful. Your caregiver may prescribe muscle relaxant drugs. These medicines help dull your pain so you can more quickly return to your normal activities and healthy exercise.  · Put ice on the injured area.  · Put ice in a plastic bag.  · Place a towel between your skin and the bag.  · Leave the ice on for 15-20 minutes, 3-4 times a day for the first 2 to 3 days. After that, ice and heat may be alternated to reduce pain and spasms.  · Ask your caregiver about trying back exercises and gentle massage. This may be of some benefit.  · Avoid feeling anxious or stressed. Stress increases muscle tension and can worsen back pain. It is important to recognize when you are anxious or stressed and learn ways to manage it. Exercise is a great option.  SEEK MEDICAL CARE IF:  · You have pain that is not relieved with rest or   medicine.  · You have pain that does not improve in 1 week.  · You have new symptoms.  · You are generally not feeling well.  SEEK IMMEDIATE MEDICAL CARE IF:   · You have pain that radiates from your back into your legs.  · You develop new bowel or bladder control problems.  · You have unusual weakness or numbness in your arms or legs.  · You develop nausea or vomiting.  · You develop abdominal pain.  · You feel faint.  Document Released: 03/15/2005 Document Revised: 09/14/2011 Document Reviewed: 08/03/2010  ExitCare® Patient Information ©2014 [employer].

## 2012-10-01 ENCOUNTER — Telehealth (HOSPITAL_COMMUNITY): Payer: Self-pay | Admitting: *Deleted

## 2012-10-01 LAB — URINE CULTURE: Colony Count: >=100000

## 2012-10-01 MED ORDER — CIPROFLOXACIN HCL 250 MG PO TABS: 250.0000 mg | ORAL_TABLET | Freq: Two times a day (BID) | ORAL | Status: DC

## 2012-10-01 NOTE — ED Notes (Signed)
Urine culture: >100,000 colonies Enterococcus, sed rate 26 H.  7/6  Almedia Balls PA e-prescribed Cipro to the [facility] on [address].  I called pt.  Pt. verified x 2 and given results.  Pt. told she needs Cipro for UTI and where to pick up Rx. Pt. instructed to tell her PCP about sed rate. Pt. wants to come by and get copy of her labs.  Pt. instructed how to do this. Pt. voiced understanding. Vassie Moselle 10/01/2012

## 2012-10-02 NOTE — ED Notes (Signed)
Pt. came in for copy of her labs for her doctor.  I printed them and put them in a brown envelope.  I gave them to [facility] and she will get pt. to sign medical release form and copy her ID. Vassie Moselle 10/02/2012

## 2012-10-05 ENCOUNTER — Encounter (HOSPITAL_COMMUNITY): Payer: Self-pay | Admitting: *Deleted

## 2012-10-05 ENCOUNTER — Emergency Department (HOSPITAL_COMMUNITY)
Admission: EM | Admit: 2012-10-05 | Discharge: 2012-10-05 | Disposition: A | Discharge status: Home / Self Care | Payer: 59 | Attending: Emergency Medicine | Admitting: Emergency Medicine

## 2012-10-05 DIAGNOSIS — J3489 Other specified disorders of nose and nasal sinuses: Secondary | ICD-10-CM | POA: Insufficient documentation

## 2012-10-05 DIAGNOSIS — I10 Essential (primary) hypertension: Secondary | ICD-10-CM | POA: Insufficient documentation

## 2012-10-05 DIAGNOSIS — Z862 Personal history of diseases of the blood and blood-forming organs and certain disorders involving the immune mechanism: Secondary | ICD-10-CM | POA: Insufficient documentation

## 2012-10-05 DIAGNOSIS — H109 Unspecified conjunctivitis: Secondary | ICD-10-CM | POA: Insufficient documentation

## 2012-10-05 DIAGNOSIS — Z8639 Personal history of other endocrine, nutritional and metabolic disease: Secondary | ICD-10-CM | POA: Insufficient documentation

## 2012-10-05 MED ORDER — TOBRAMYCIN 0.3 % OP SOLN: 1.0000 [drp] | OPHTHALMIC | Status: DC

## 2012-10-05 MED ORDER — TETRACAINE HCL 0.5 % OP SOLN
2.0000 [drp] | Freq: Once | OPHTHALMIC | Status: AC
  Administered 2012-10-05: 2 [drp] via OPHTHALMIC
  Filled 2012-10-05: qty 2

## 2012-10-05 MED ORDER — FLUORESCEIN SODIUM 1 MG OP STRP
1.0000 | ORAL_STRIP | Freq: Once | OPHTHALMIC | Status: AC
  Administered 2012-10-05: 1 via OPHTHALMIC
  Filled 2012-10-05: qty 1

## 2012-10-05 NOTE — ED Notes (Signed)
Pt presents to the ED with left eye drainage, pain and minor swelling. Pt denies any injury to eye, pt reports blurry vision.

## 2012-10-05 NOTE — ED Provider Notes (Signed)
History    CSN: [id number] Arrival date & time 10/05/12  0413  First MD Initiated Contact with Patient 10/05/12 [phone]     Chief Complaint  Patient presents with  . Eye Drainage   (Consider location/radiation/quality/duration/timing/severity/associated sxs/prior Treatment) HPI Comments: Patient is a 62 year old female with no significant past medical history presents for left eye redness and discharge with left mild eyelid swelling x 2 days. Discharge is greenish/yellow in appearance; she endorses crusting on her lashes upon waking. Patient states the symptoms have been progressing since onset without any modifying factors. She admits to associated nasal congestion and denies associated fevers, vision changes or vision loss, eye pain, ear discharge, sore throat, difficulty swallowing, and headaches.  The history is provided by the patient. No language interpreter was used.   Past Medical History  Diagnosis Date  . Hypertension   . Hyperlipidemia    Past Surgical History  Procedure Laterality Date  . Abdominal hysterectomy     History reviewed. No pertinent family history. History  Substance Use Topics  . Smoking status: Never Smoker   . Smokeless tobacco: Not on file  . Alcohol Use: Yes     Comment: Occassionally - less than qmonthly   OB History   Grav Para Term Preterm Abortions TAB SAB Ect Mult Living                 Review of Systems  Constitutional: Negative for fever.  HENT: Positive for congestion. Negative for ear pain, sore throat, rhinorrhea, drooling, trouble swallowing, neck pain, neck stiffness and ear discharge.   Eyes: Positive for discharge and redness. Negative for pain and visual disturbance.  All other systems reviewed and are negative.    Allergies  Review of patient's allergies indicates no known allergies.  Home Medications   Current Outpatient Rx  Name  Route  Sig  Dispense  Refill  . ciprofloxacin (CIPRO) 250 MG tablet   Oral   Take 1  tablet (250 mg total) by mouth every 12 (twelve) hours.   10 tablet   0   . Hyprom-Naphaz-Polysorb-Zn Sulf (CLEAR EYES COMPLETE OP)   Ophthalmic   Apply 1 drop to eye as needed (for eye irritation).         Marland Kitchen tobramycin (TOBREX) 0.3 % ophthalmic solution   Ophthalmic   Apply 1 drop to eye every 4 (four) hours. Place one drop in the affected eye every 4 hours for 7 days   5 mL   0    BP 172/98  Pulse 80  Temp(Src) 98.5 F (36.9 C) (Oral)  Resp 18  SpO2 96% Physical Exam  Constitutional: She is oriented to person, place, and time. She appears well-developed and well-nourished. No distress.  HENT:  Head: Normocephalic and atraumatic.  Right Ear: Tympanic membrane, external ear and ear canal normal.  Left Ear: Tympanic membrane, external ear and ear canal normal.  Nose: Nose normal.  Mouth/Throat: Uvula is midline, oropharynx is clear and moist and mucous membranes are normal.  Airway patent. Patient tolerating secretions without difficulty.  Eyes: EOM are normal. Pupils are equal, round, and reactive to light. Right eye exhibits no discharge. No foreign body present in the right eye. Left eye exhibits discharge. No foreign body present in the left eye. Right conjunctiva is not injected. Right conjunctiva has no hemorrhage. Left conjunctiva is injected. Left conjunctiva has no hemorrhage. No scleral icterus.  Fundoscopic exam:      The right eye shows no exudate and no  hemorrhage. The right eye shows red reflex.       The left eye shows no exudate and no hemorrhage. The left eye shows red reflex.  No uptake on fluorescein staining. No corneal abrasions or ulcerations.  Neck: Normal range of motion. Neck supple.  Cardiovascular: Normal rate, regular rhythm and intact distal pulses.   Pulmonary/Chest: Effort normal. No stridor. No respiratory distress.  Musculoskeletal: Normal range of motion.  Lymphadenopathy:    She has no cervical adenopathy.  Neurological: She is alert and  oriented to person, place, and time.  Skin: Skin is warm and dry. No rash noted. She is not diaphoretic. No erythema. No pallor.  Psychiatric: She has a normal mood and affect. Her behavior is normal.   ED Course  Procedures (including critical care time) Labs Reviewed - No data to display No results found.  1. Bacterial conjunctivitis of left eye    MDM  Uncomplicated bacterial conjunctivitis - Physical exam as above. Visual acuity intact and no uptake on fluorescein staining. No pain with EOMs and PERRLA. No hx of eye trauma. Patient afebrile and hemodynamically stable. Appropriate for d/c with Rx for Tobrex for symptoms and PCP follow up to ensure that symptoms resolve. Indications for ED return discussed with the patient who verbalizes comfort and understanding with this d/c plan.    Antony Madura, PA-C 10/10/12 [phone]

## 2012-10-10 NOTE — ED Provider Notes (Signed)
Medical screening examination/treatment/procedure(s) were performed by non-physician practitioner and as supervising physician I was immediately available for consultation/collaboration.  Geoffery Lyons, MD 10/10/12 [phone]

## 2012-11-03 ENCOUNTER — Encounter: Payer: Self-pay | Admitting: Diagnostic Neuroimaging

## 2012-11-03 ENCOUNTER — Ambulatory Visit (INDEPENDENT_AMBULATORY_CARE_PROVIDER_SITE_OTHER): Payer: 59 | Admitting: Diagnostic Neuroimaging

## 2012-11-03 ENCOUNTER — Telehealth: Payer: Self-pay | Admitting: Diagnostic Neuroimaging

## 2012-11-03 VITALS — BP 152/83 | HR 80 | Temp 98.7°F | Ht 62.0 in | Wt 174.0 lb

## 2012-11-03 DIAGNOSIS — M79602 Pain in left arm: Secondary | ICD-10-CM

## 2012-11-03 DIAGNOSIS — M542 Cervicalgia: Secondary | ICD-10-CM | POA: Insufficient documentation

## 2012-11-03 DIAGNOSIS — M79609 Pain in unspecified limb: Secondary | ICD-10-CM

## 2012-11-03 DIAGNOSIS — R209 Unspecified disturbances of skin sensation: Secondary | ICD-10-CM

## 2012-11-03 DIAGNOSIS — R200 Anesthesia of skin: Secondary | ICD-10-CM

## 2012-11-03 DIAGNOSIS — R202 Paresthesia of skin: Secondary | ICD-10-CM

## 2012-11-03 DIAGNOSIS — G43009 Migraine without aura, not intractable, without status migrainosus: Secondary | ICD-10-CM

## 2012-11-03 MED ORDER — SUMATRIPTAN SUCCINATE 100 MG PO TABS: 100.0000 mg | ORAL_TABLET | Freq: Once | ORAL | Status: DC | PRN

## 2012-11-03 NOTE — Patient Instructions (Signed)
Migraine Headache A migraine headache is an intense, throbbing pain on one or both sides of your head. A migraine can last for 30 minutes to several hours. CAUSES  The exact cause of a migraine headache is not always known. However, a migraine may be caused when nerves in the brain become irritated and release chemicals that cause inflammation. This causes pain. SYMPTOMS  Pain on one or both sides of your head.  Pulsating or throbbing pain.  Severe pain that prevents daily activities.  Pain that is aggravated by any physical activity.  Nausea, vomiting, or both.  Dizziness.  Pain with exposure to bright lights, loud noises, or activity.  General sensitivity to bright lights, loud noises, or smells. Before you get a migraine, you may get warning signs that a migraine is coming (aura). An aura may include:  Seeing flashing lights.  Seeing bright spots, halos, or zig-zag lines.  Having tunnel vision or blurred vision.  Having feelings of numbness or tingling.  Having trouble talking.  Having muscle weakness. MIGRAINE TRIGGERS  Alcohol.  Smoking.  Stress.  Menstruation.  Aged cheeses.  Foods or drinks that contain nitrates, glutamate, aspartame, or tyramine.  Lack of sleep.  Chocolate.  Caffeine.  Hunger.  Physical exertion.  Fatigue.  Medicines used to treat chest pain (nitroglycerine), birth control pills, estrogen, and some blood pressure medicines. DIAGNOSIS  A migraine headache is often diagnosed based on:  Symptoms.  Physical examination.  A CT scan or MRI of your head. TREATMENT Medicines may be given for pain and nausea. Medicines can also be given to help prevent recurrent migraines.  HOME CARE INSTRUCTIONS  Only take over-the-counter or prescription medicines for pain or discomfort as directed by your caregiver. The use of long-term narcotics is not recommended.  Lie down in a dark, quiet room when you have a migraine.  Keep a journal  to find out what may trigger your migraine headaches. For example, write down:  What you eat and drink.  How much sleep you get.  Any change to your diet or medicines.  Limit alcohol consumption.  Quit smoking if you smoke.  Get 7 to 9 hours of sleep, or as recommended by your caregiver.  Limit stress.  Keep lights dim if bright lights bother you and make your migraines worse. SEEK IMMEDIATE MEDICAL CARE IF:   Your migraine becomes severe.  You have a fever.  You have a stiff neck.  You have vision loss.  You have muscular weakness or loss of muscle control.  You start losing your balance or have trouble walking.  You feel faint or pass out.  You have severe symptoms that are different from your first symptoms. MAKE SURE YOU:   Understand these instructions.  Will watch your condition.  Will get help right away if you are not doing well or get worse. Document Released: 03/15/2005 Document Revised: 06/07/2011 Document Reviewed: 03/05/2011 ExitCare Patient Information 2014 [employer].  

## 2012-11-03 NOTE — Progress Notes (Signed)
[facility]  PATIENT: Erika Mosley DOB: 12-Aug-1950  REFERRING CLINICIAN: Spears HISTORY FROM: patient REASON FOR VISIT: new consult   HISTORICAL  CHIEF COMPLAINT:  Chief Complaint  Patient presents with  . Headache    HISTORY OF PRESENT ILLNESS:  62 year old right-handed female here for evaluation of abnormal sensation in her head.  Patient has 25 year history of headaches which are global, severe, throbbing headaches with photophobia, phonophobia, nausea without vomiting. She has a hot sensation all over. Over the years she would typically have 2 or 3 of these per year each lasting one to 2 days. In 2014 these have become more frequent, and longer duration. She's had 3 or 4 episodes already in 2014 each lasting up to 4 days.  July 4 weekend, patient had a new kind of symptom consisting of right year of electrical sensation, migrating to the left year, occipital region and top of her head. She also has some neck pain and electrical and aching pain in her left arm.  In 2014 patient is also had 4 bouts of severe vertigo for which she has tried meclizine with mild relief.  Patient cannot identify any triggering or aggravate factors. Currently patient symptoms are minimal with a mild pressure sensation in her head.  REVIEW OF SYSTEMS: Full 14 system review of systems performed and notable only for fatigue spinning sensation blurred vision anemia EEG presyncope and allergy headache shift work.  ALLERGIES: No Known Allergies  HOME MEDICATIONS: Prior to Admission medications   Medication Sig Start Date End Date Taking? Authorizing Provider  ciprofloxacin (CIPRO) 250 MG tablet Take 1 tablet (250 mg total) by mouth every 12 (twelve) hours. 10/01/12  Yes Graylon Good, PA-C  Hyprom-Naphaz-Polysorb-Zn Sulf (CLEAR EYES COMPLETE OP) Apply 1 drop to eye as needed (for eye irritation).   Yes Historical Provider, MD  ibuprofen (ADVIL,MOTRIN) 800 MG tablet Take 800 mg by  mouth every 8 (eight) hours as needed for pain.   Yes Historical Provider, MD  tobramycin (TOBREX) 0.3 % ophthalmic solution Apply 1 drop to eye every 4 (four) hours. Place one drop in the affected eye every 4 hours for 7 days 10/05/12  Yes Antony Madura, PA-C  SUMAtriptan (IMITREX) 100 MG tablet Take 1 tablet (100 mg total) by mouth once as needed for migraine. May repeat x 1 after 2 hours; maximum 2 tabs per day and 8 tabs per month 11/03/12   Suanne Marker, MD   Outpatient Prescriptions Prior to Visit  Medication Sig Dispense Refill  . ciprofloxacin (CIPRO) 250 MG tablet Take 1 tablet (250 mg total) by mouth every 12 (twelve) hours.  10 tablet  0  . Hyprom-Naphaz-Polysorb-Zn Sulf (CLEAR EYES COMPLETE OP) Apply 1 drop to eye as needed (for eye irritation).      Marland Kitchen tobramycin (TOBREX) 0.3 % ophthalmic solution Apply 1 drop to eye every 4 (four) hours. Place one drop in the affected eye every 4 hours for 7 days  5 mL  0   No facility-administered medications prior to visit.    PAST MEDICAL HISTORY: Past Medical History  Diagnosis Date  . Hypertension   . Hyperlipidemia   . Vertigo   . Cervical cancer     PAST SURGICAL HISTORY: Past Surgical History  Procedure Laterality Date  . Abdominal hysterectomy      FAMILY HISTORY: Family History  Problem Relation Age of Onset  . Cancer Mother   . Cancer Father     SOCIAL HISTORY:  History   Social History  . Marital Status: Divorced    Spouse Name: N/A    Number of Children: 3  . Years of Education: HS   Occupational History  .      [employer]   Social History Main Topics  . Smoking status: Never Smoker   . Smokeless tobacco: Never Used  . Alcohol Use: Yes     Comment: Occassionally - less than qmonthly  . Drug Use: No  . Sexually Active: Not on file   Other Topics Concern  . Not on file   Social History Narrative   Patient lives at home with family.   Caffeine Use: Rarely     PHYSICAL EXAM  Filed  Vitals:   11/03/12 0901  BP: 152/83  Pulse: 80  Temp: 98.7 F (37.1 C)  TempSrc: Oral  Height: 5\' 2"  (1.575 m)  Weight: 174 lb (78.926 kg)    Not recorded    Body mass index is 31.82 kg/(m^2).  GENERAL EXAM: Patient is in no distress; TEMPORAL ARTERIES ARE PALPABLE AND SYMM; NO TEMPORAL ARTERY TENDERNESS.  CARDIOVASCULAR: Regular rate and rhythm, no murmurs, no carotid bruits  NEUROLOGIC: MENTAL STATUS: awake, alert, language fluent, comprehension intact, naming intact CRANIAL NERVE: no papilledema on fundoscopic exam, pupils equal and reactive to light, visual fields full to confrontation, extraocular muscles intact, no nystagmus, facial sensation and strength symmetric, uvula midline, shoulder shrug symmetric, tongue midline. MOTOR: normal bulk and tone, full strength in the BUE, BLE SENSORY: normal and symmetric to light touch, pinprick, temperature, vibration COORDINATION: finger-nose-finger, fine finger movements normal REFLEXES: BUE 1, KNEES 1, ANKLES TRACE. GAIT/STATION: narrow based gait; able to walk on toes, heels and tandem; romberg is negative   DIAGNOSTIC DATA (LABS, IMAGING, TESTING) - I reviewed patient records, labs, notes, testing and imaging myself where available.  Lab Results  Component Value Date   WBC 12.6* 09/29/2012   HGB 14.3 09/29/2012   HCT 42.0 09/29/2012   MCV 90.8 09/29/2012   PLT 264 09/29/2012      Component Value Date/Time   NA 141 09/29/2012 1701   K 3.6 09/29/2012 1701   CL 104 09/29/2012 1701   CO2 27 12/11/2007 0855   GLUCOSE 103* 09/29/2012 1701   BUN 12 09/29/2012 1701   CREATININE 0.80 09/29/2012 1701   CALCIUM 8.8 12/11/2007 0855   GFRNONAA >60 12/11/2007 0855   GFRAA  Value: >60        The eGFR has been calculated using the MDRD equation. This calculation has not been validated in all clinical 12/11/2007 0855   No results found for this basename: CHOL, HDL, LDLCALC, LDLDIRECT, TRIG, CHOLHDL   No results found for this basename: HGBA1C   No  results found for this basename: VITAMINB12   No results found for this basename: TSH    05/17/12 CT HEAD - No CT evidence of acute intracranial abnormality  Mild ethmoid air cell opacities. Correlate clinically for  early/mild sinusitis.    ASSESSMENT AND PLAN  62 y.o. year old female here with 25 year history of migraine headaches without aura. Now with new type of electrical sensation in her head, neck, left arm.  Localization: Brain, occipital nerve, cervical root, peripheral nerve  Ddx: CNS inflammatory, C2 radiculopathy, occipital neuralgia, migraine variant  PLAN: 1. MRI brain and cervical spine 2. Check ESR and CRP 3. Trial of sumatriptan for migraine headaches   Orders Placed This Encounter  Procedures  . MR Brain Wo Contrast  .  MR Cervical Spine Wo Contrast  . Sedimentation Rate  . C-reactive Protein    Meds ordered this encounter  Medications  . ibuprofen (ADVIL,MOTRIN) 800 MG tablet    Sig: Take 800 mg by mouth every 8 (eight) hours as needed for pain.  . SUMAtriptan (IMITREX) 100 MG tablet    Sig: Take 1 tablet (100 mg total) by mouth once as needed for migraine. May repeat x 1 after 2 hours; maximum 2 tabs per day and 8 tabs per month    Dispense:  8 tablet    Refill:  6    Return in about 2 months (around 01/03/2013) for with Heide Guile or Nathanuel Cabreja.    Suanne Marker, MD 11/03/2012, 9:39 AM Certified in Neurology, Neurophysiology and Neuroimaging  [facility] [address], [address] [place], [place] [address] [phone]

## 2012-11-06 LAB — C-REACTIVE PROTEIN: CRP: 4.4 mg/L (ref 0.0–4.9)

## 2012-11-16 ENCOUNTER — Ambulatory Visit (INDEPENDENT_AMBULATORY_CARE_PROVIDER_SITE_OTHER): Payer: 59

## 2012-11-16 DIAGNOSIS — R209 Unspecified disturbances of skin sensation: Secondary | ICD-10-CM

## 2012-11-16 DIAGNOSIS — M79602 Pain in left arm: Secondary | ICD-10-CM

## 2012-11-16 DIAGNOSIS — M542 Cervicalgia: Secondary | ICD-10-CM

## 2012-11-16 DIAGNOSIS — R200 Anesthesia of skin: Secondary | ICD-10-CM

## 2012-11-16 DIAGNOSIS — G43009 Migraine without aura, not intractable, without status migrainosus: Secondary | ICD-10-CM

## 2012-11-16 DIAGNOSIS — M79609 Pain in unspecified limb: Secondary | ICD-10-CM

## 2013-02-05 ENCOUNTER — Other Ambulatory Visit: Payer: Self-pay

## 2013-02-05 DIAGNOSIS — Z1231 Encounter for screening mammogram for malignant neoplasm of breast: Secondary | ICD-10-CM

## 2013-03-05 ENCOUNTER — Ambulatory Visit
Admission: RE | Admit: 2013-03-05 | Discharge: 2013-03-05 | Disposition: A | Discharge status: Home / Self Care | Payer: PRIVATE HEALTH INSURANCE | Source: Ambulatory Visit

## 2013-03-05 DIAGNOSIS — Z1231 Encounter for screening mammogram for malignant neoplasm of breast: Secondary | ICD-10-CM

## 2013-07-19 ENCOUNTER — Emergency Department (HOSPITAL_COMMUNITY)
Admission: EM | Admit: 2013-07-19 | Discharge: 2013-07-20 | Disposition: A | Discharge status: Home / Self Care | Payer: 59 | Attending: Emergency Medicine | Admitting: Emergency Medicine

## 2013-07-19 ENCOUNTER — Encounter (HOSPITAL_COMMUNITY): Payer: Self-pay | Admitting: Emergency Medicine

## 2013-07-19 DIAGNOSIS — Z862 Personal history of diseases of the blood and blood-forming organs and certain disorders involving the immune mechanism: Secondary | ICD-10-CM | POA: Insufficient documentation

## 2013-07-19 DIAGNOSIS — R638 Other symptoms and signs concerning food and fluid intake: Secondary | ICD-10-CM | POA: Insufficient documentation

## 2013-07-19 DIAGNOSIS — R6883 Chills (without fever): Secondary | ICD-10-CM | POA: Insufficient documentation

## 2013-07-19 DIAGNOSIS — I10 Essential (primary) hypertension: Secondary | ICD-10-CM | POA: Insufficient documentation

## 2013-07-19 DIAGNOSIS — M79609 Pain in unspecified limb: Secondary | ICD-10-CM | POA: Insufficient documentation

## 2013-07-19 DIAGNOSIS — T8140XA Infection following a procedure, unspecified, initial encounter: Secondary | ICD-10-CM | POA: Insufficient documentation

## 2013-07-19 DIAGNOSIS — M79671 Pain in right foot: Secondary | ICD-10-CM

## 2013-07-19 DIAGNOSIS — Y838 Other surgical procedures as the cause of abnormal reaction of the patient, or of later complication, without mention of misadventure at the time of the procedure: Secondary | ICD-10-CM | POA: Insufficient documentation

## 2013-07-19 DIAGNOSIS — Z8639 Personal history of other endocrine, nutritional and metabolic disease: Secondary | ICD-10-CM | POA: Insufficient documentation

## 2013-07-19 DIAGNOSIS — R110 Nausea: Secondary | ICD-10-CM | POA: Insufficient documentation

## 2013-07-19 DIAGNOSIS — Z8541 Personal history of malignant neoplasm of cervix uteri: Secondary | ICD-10-CM | POA: Insufficient documentation

## 2013-07-19 MED ORDER — ONDANSETRON 4 MG PO TBDP
4.0000 mg | ORAL_TABLET | Freq: Once | ORAL | Status: AC
  Administered 2013-07-19: 4 mg via ORAL
  Filled 2013-07-19: qty 1

## 2013-07-19 NOTE — ED Notes (Signed)
The patient was complaining of nausea, administered Zofran, ODT.

## 2013-07-19 NOTE — ED Notes (Signed)
Pt had bunyan removal to right foot on Monday at [facility] surgery. Reports today she put heating pad on surgical site, which then caused shooting pains up leg and site to bleed. Called PCP who instructed her to remove bandage and leave site open to air, elevate and place ice on area. Pt did as instructed but here because pain remains 8/10. Surgical site clean, dry, intact. No bleeding noted. Denies F/C. Pt in NAD.

## 2013-07-20 ENCOUNTER — Emergency Department (HOSPITAL_COMMUNITY): Payer: 59

## 2013-07-20 LAB — BASIC METABOLIC PANEL
BUN: 14 mg/dL (ref 6–23)
CO2: 24 mEq/L (ref 19–32)
Calcium: 10.3 mg/dL (ref 8.4–10.5)
Chloride: 101 mEq/L (ref 96–112)
Creatinine, Ser: 0.68 mg/dL (ref 0.50–1.10)
GFR calc Af Amer: >90 mL/min (ref >90)
GFR calc non Af Amer: >90 mL/min (ref >90)
Glucose, Bld: 113 mg/dL — ABNORMAL HIGH (ref 70–99)
Potassium: 4.4 mEq/L (ref 3.7–5.3)
Sodium: 139 mEq/L (ref 137–147)

## 2013-07-20 LAB — CBC WITH DIFFERENTIAL/PLATELET
Basophils Absolute: 0 10*3/uL (ref 0.0–0.1)
Basophils Relative: 0 % (ref 0–1)
Eosinophils Absolute: 0 10*3/uL (ref 0.0–0.7)
Eosinophils Relative: 0 % (ref 0–5)
HCT: 41.4 % (ref 36.0–46.0)
Hemoglobin: 13.8 g/dL (ref 12.0–15.0)
Lymphocytes Relative: 22 % (ref 12–46)
Lymphs Abs: 3 10*3/uL (ref 0.7–4.0)
MCH: 30.1 pg (ref 26.0–34.0)
MCHC: 33.3 g/dL (ref 30.0–36.0)
MCV: 90.2 fL (ref 78.0–100.0)
Monocytes Absolute: 1.1 10*3/uL — ABNORMAL HIGH (ref 0.1–1.0)
Monocytes Relative: 8 % (ref 3–12)
Neutro Abs: 9.4 10*3/uL — ABNORMAL HIGH (ref 1.7–7.7)
Neutrophils Relative %: 70 % (ref 43–77)
Platelets: 339 10*3/uL (ref 150–400)
RBC: 4.59 MIL/uL (ref 3.87–5.11)
RDW: 14.3 % (ref 11.5–15.5)
WBC: 13.4 10*3/uL — ABNORMAL HIGH (ref 4.0–10.5)

## 2013-07-20 LAB — SEDIMENTATION RATE: Sed Rate: 56 mm/hr — ABNORMAL HIGH (ref 0–22)

## 2013-07-20 MED ORDER — HYDROMORPHONE HCL PF 1 MG/ML IJ SOLN
1.0000 mg | Freq: Once | INTRAMUSCULAR | Status: AC
  Administered 2013-07-20: 1 mg via INTRAVENOUS
  Filled 2013-07-20: qty 1

## 2013-07-20 MED ORDER — SODIUM CHLORIDE 0.9 % IV BOLUS (SEPSIS)
500.0000 mL | Freq: Once | INTRAVENOUS | Status: AC
  Administered 2013-07-20: 500 mL via INTRAVENOUS

## 2013-07-20 MED ORDER — PIPERACILLIN-TAZOBACTAM 3.375 G IVPB 30 MIN
3.3750 g | Freq: Once | INTRAVENOUS | Status: AC
  Administered 2013-07-20: 3.375 g via INTRAVENOUS
  Filled 2013-07-20: qty 50

## 2013-07-20 MED ORDER — HYDROCODONE-ACETAMINOPHEN 5-325 MG PO TABS: 2.0000 | ORAL_TABLET | ORAL | Status: DC | PRN

## 2013-07-20 MED ORDER — DOXYCYCLINE HYCLATE 100 MG PO CAPS: 100.0000 mg | ORAL_CAPSULE | Freq: Two times a day (BID) | ORAL | Status: DC

## 2013-07-20 MED ORDER — DOXYCYCLINE HYCLATE 100 MG PO TABS
100.0000 mg | ORAL_TABLET | Freq: Once | ORAL | Status: AC
  Administered 2013-07-20: 100 mg via ORAL
  Filled 2013-07-20: qty 1

## 2013-07-20 NOTE — ED Provider Notes (Signed)
CSN: [id number]     Arrival date & time 07/19/13  2234 History   First MD Initiated Contact with Patient 07/19/13 2354     Chief Complaint  Patient presents with  . Post-op Problem     (Consider location/radiation/quality/duration/timing/severity/associated sxs/prior Treatment) HPI Comments: 63 year old female with bunion history presents with worsening right medial foot pain since Monday when she had bunionectomy through [facility] surgery Dr. Melony Overly.  Patient has had worsening pain gradually since especially today. Patient called the surgeon who instructed taking off the surgical gauze however no improvement with that. Patient has been elevating and applying ice with minimal improvement. Patient has been taking oxycodone every 6 hours with only mild improvement. Patient has mild chills but no fevers or streaking redness. Mild redness at the site without discharge and patient had mild bleeding. Patient had similar surgery on the left foot and this pain is more significant than that. No difficulties with surgery per patient. Pain with palpation and movement, constant  The history is provided by the patient and a relative.    Past Medical History  Diagnosis Date  . Hypertension   . Hyperlipidemia   . Vertigo   . Cervical cancer    Past Surgical History  Procedure Laterality Date  . Abdominal hysterectomy     Family History  Problem Relation Age of Onset  . Cancer Mother   . Cancer Father    History  Substance Use Topics  . Smoking status: Never Smoker   . Smokeless tobacco: Never Used  . Alcohol Use: Yes     Comment: Occassionally - less than qmonthly   OB History   Grav Para Term Preterm Abortions TAB SAB Ect Mult Living                 Review of Systems  Constitutional: Positive for chills and appetite change. Negative for fever.  Respiratory: Negative for shortness of breath.   Cardiovascular: Negative for chest pain.  Gastrointestinal: Positive for nausea. Negative for  vomiting and abdominal pain.  Genitourinary: Negative for dysuria and flank pain.  Musculoskeletal: Negative for neck pain and neck stiffness.  Skin: Positive for wound. Negative for rash.  Neurological: Negative for light-headedness and headaches.      Allergies  Review of patient's allergies indicates no known allergies.  Home Medications   Prior to Admission medications   Medication Sig Start Date End Date Taking? Authorizing Provider  ciprofloxacin (CIPRO) 250 MG tablet Take 1 tablet (250 mg total) by mouth every 12 (twelve) hours. 10/01/12   Liam Graham, PA-C  Hyprom-Naphaz-Polysorb-Zn Sulf (CLEAR EYES COMPLETE OP) Apply 1 drop to eye as needed (for eye irritation).    Historical Provider, MD  ibuprofen (ADVIL,MOTRIN) 800 MG tablet Take 800 mg by mouth every 8 (eight) hours as needed for pain.    Historical Provider, MD  SUMAtriptan (IMITREX) 100 MG tablet Take 1 tablet (100 mg total) by mouth once as needed for migraine. May repeat x 1 after 2 hours; maximum 2 tabs per day and 8 tabs per month 11/03/12   Vikram R Penumalli, MD  tobramycin (TOBREX) 0.3 % ophthalmic solution Apply 1 drop to eye every 4 (four) hours. Place one drop in the affected eye every 4 hours for 7 days 10/05/12   Antonietta Breach, PA-C   BP 100/31  Pulse 60  Temp(Src) 98 F (36.7 C) (Oral)  Resp 20  Ht 5' 2.5" (1.588 m)  Wt 172 lb (78.019 kg)  BMI 30.94 kg/m2  SpO2 100% Physical Exam  Nursing note and vitals reviewed. Constitutional: She is oriented to person, place, and time. She appears well-developed and well-nourished.  HENT:  Head: Normocephalic and atraumatic.  Eyes: Conjunctivae are normal. Right eye exhibits no discharge. Left eye exhibits no discharge.  Neck: Normal range of motion. Neck supple. No tracheal deviation present.  Cardiovascular: Normal rate and regular rhythm.   Pulmonary/Chest: Effort normal.  Abdominal: Soft. She exhibits no distension. There is no tenderness. There is no guarding.   Musculoskeletal: She exhibits edema and tenderness.  Neurological: She is alert and oriented to person, place, and time.  Skin: Skin is warm. No rash noted.  Patient has moderate to severe tenderness dorsum aspect of great toe and proximal on the right foot. Surgical incision with no active discharge or bleeding. Patient has mild warmth surrounding the area of surgical incision. No streaking redness. Neurovascular intact distal. Pain with range of motion of toes.  Psychiatric: She has a normal mood and affect.    ED Course  Procedures (including critical care time) Labs Review Labs Reviewed  CBC WITH DIFFERENTIAL - Abnormal; Notable for the following:    WBC 13.4 (*)    Neutro Abs 9.4 (*)    Monocytes Absolute 1.1 (*)    All other components within normal limits  BASIC METABOLIC PANEL - Abnormal; Notable for the following:    Glucose, Bld 113 (*)    All other components within normal limits  SEDIMENTATION RATE - Abnormal; Notable for the following:    Sed Rate 56 (*)    All other components within normal limits  CULTURE, BLOOD (ROUTINE X 2)  CULTURE, BLOOD (ROUTINE X 2)    Imaging Review Dg Foot Complete Right  07/20/2013   CLINICAL DATA:  Recent surgery in the first MTP joint with pain.  EXAM: RIGHT FOOT COMPLETE - 3+ VIEW  COMPARISON:  None.  FINDINGS: Postoperative changes with osteotomy of the proximal phalanx of the right first toe, stable fixation, and osteotomy of the distal aspect of the right first metatarsal bone. Normal alignment is demonstrated. No displaced fractures appreciated. Mild soft tissue swelling.  IMPRESSION: Postoperative changes in the right first metatarsal and proximal phalanx.   Electronically Signed   By: Lucienne Capers M.D.   On: 07/20/2013 01:42     EKG Interpretation None      MDM   Final diagnoses:  Post op infection  Right foot pain   Clinical concern for postop infection versus pain control since surgery first other. Plan for blood  work, sedimentation rate, x-ray and pain medicines. Will discuss further with Dr. Melony Overly.     Lab result showed mild white blood cell count elevation and elevated sedimentation rate. Concern for postop infection and discussed with Dr. Melony Overly who agreed with IV antibiotics and she wishes to see patient in the office and not be admitted at this time. Pain improved with multiple pain doses in ED. Dr. Lenore Cordia recommended doxycycline oral and Zosyn IV.  Results and differential diagnosis were discussed with the patient. Close follow up outpatient was discussed, patient comfortable with the plan.   Filed Vitals:   07/20/13 0155 07/20/13 0200 07/20/13 0230 07/20/13 0300  BP:  125/68 116/52 131/64  Pulse: 71 68 67 65  Temp:      TempSrc:      Resp:      Height:      Weight:      SpO2: 99% 96% 99% 97%  Mariea Clonts, MD 07/20/13 [phone]

## 2013-07-20 NOTE — Discharge Instructions (Signed)
See your surgeon today for recheck. Return for fevers or worsening symptoms. Take antibiotics as directed.  If you were given medicines take as directed.  If you are on coumadin or contraceptives realize their levels and effectiveness is altered by many different medicines.  If you have any reaction (rash, tongues swelling, other) to the medicines stop taking and see a physician.   Please follow up as directed and return to the ER or see a physician for new or worsening symptoms.  Thank you. Filed Vitals:   07/20/13 0155 07/20/13 0200 07/20/13 0230 07/20/13 0300  BP:  125/68 116/52 131/64  Pulse: 71 68 67 65  Temp:      TempSrc:      Resp:      Height:      Weight:      SpO2: 99% 96% 99% 97%

## 2013-07-26 LAB — CULTURE, BLOOD (ROUTINE X 2)
CULTURE: NO GROWTH
Culture: NO GROWTH

## 2014-01-25 ENCOUNTER — Other Ambulatory Visit: Payer: Self-pay

## 2014-01-25 DIAGNOSIS — Z1231 Encounter for screening mammogram for malignant neoplasm of breast: Secondary | ICD-10-CM

## 2014-03-06 ENCOUNTER — Ambulatory Visit
Admission: RE | Admit: 2014-03-06 | Discharge: 2014-03-06 | Disposition: A | Discharge status: Home / Self Care | Payer: 59 | Source: Ambulatory Visit

## 2014-03-06 DIAGNOSIS — Z1231 Encounter for screening mammogram for malignant neoplasm of breast: Secondary | ICD-10-CM

## 2014-03-07 ENCOUNTER — Ambulatory Visit: Payer: 59

## 2015-01-03 ENCOUNTER — Encounter (HOSPITAL_COMMUNITY): Payer: Self-pay | Admitting: Emergency Medicine

## 2015-01-03 ENCOUNTER — Emergency Department (HOSPITAL_COMMUNITY): Payer: 59

## 2015-01-03 ENCOUNTER — Emergency Department (HOSPITAL_COMMUNITY)
Admission: EM | Admit: 2015-01-03 | Discharge: 2015-01-03 | Disposition: A | Discharge status: Home / Self Care | Payer: 59 | Attending: Emergency Medicine | Admitting: Emergency Medicine

## 2015-01-03 DIAGNOSIS — E785 Hyperlipidemia, unspecified: Secondary | ICD-10-CM | POA: Insufficient documentation

## 2015-01-03 DIAGNOSIS — Y9389 Activity, other specified: Secondary | ICD-10-CM | POA: Diagnosis not present

## 2015-01-03 DIAGNOSIS — Z79899 Other long term (current) drug therapy: Secondary | ICD-10-CM | POA: Insufficient documentation

## 2015-01-03 DIAGNOSIS — S0990XA Unspecified injury of head, initial encounter: Secondary | ICD-10-CM | POA: Insufficient documentation

## 2015-01-03 DIAGNOSIS — I10 Essential (primary) hypertension: Secondary | ICD-10-CM | POA: Diagnosis not present

## 2015-01-03 DIAGNOSIS — Y92410 Unspecified street and highway as the place of occurrence of the external cause: Secondary | ICD-10-CM | POA: Diagnosis not present

## 2015-01-03 DIAGNOSIS — Z8541 Personal history of malignant neoplasm of cervix uteri: Secondary | ICD-10-CM | POA: Diagnosis not present

## 2015-01-03 DIAGNOSIS — M545 Low back pain: Secondary | ICD-10-CM

## 2015-01-03 DIAGNOSIS — Y998 Other external cause status: Secondary | ICD-10-CM | POA: Insufficient documentation

## 2015-01-03 DIAGNOSIS — S3992XA Unspecified injury of lower back, initial encounter: Secondary | ICD-10-CM | POA: Insufficient documentation

## 2015-01-03 DIAGNOSIS — Z7982 Long term (current) use of aspirin: Secondary | ICD-10-CM | POA: Diagnosis not present

## 2015-01-03 MED ORDER — CYCLOBENZAPRINE HCL 10 MG PO TABS: 10.0000 mg | ORAL_TABLET | Freq: Two times a day (BID) | ORAL | Status: DC | PRN

## 2015-01-03 MED ORDER — ACETAMINOPHEN 325 MG PO TABS
650.0000 mg | ORAL_TABLET | Freq: Once | ORAL | Status: AC
  Administered 2015-01-03: 650 mg via ORAL
  Filled 2015-01-03: qty 2
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
## 2015-01-03 NOTE — Discharge Instructions (Signed)
Back Pain, Adult °Back pain is very common in adults. The cause of back pain is rarely dangerous and the pain often gets better over time. The cause of your back pain may not be known. Some common causes of back pain include: °· Strain of the muscles or ligaments supporting the spine. °· Wear and tear (degeneration) of the spinal disks. °· Arthritis. °· Direct injury to the back. °For many people, back pain may return. Since back pain is rarely dangerous, most people can learn to manage this condition on their own. °HOME CARE INSTRUCTIONS °Watch your back pain for any changes. The following actions may help to lessen any discomfort you are feeling: °· Remain active. It is stressful on your back to sit or stand in one place for long periods of time. Do not sit, drive, or stand in one place for more than 30 minutes at a time. Take short walks on even surfaces as soon as you are able. Try to increase the length of time you walk each day. °· Exercise regularly as directed by your health care provider. Exercise helps your back heal faster. It also helps avoid future injury by keeping your muscles strong and flexible. °· Do not stay in bed. Resting more than 1-2 days can delay your recovery. °· Pay attention to your body when you bend and lift. The most comfortable positions are those that put less stress on your recovering back. Always use proper lifting techniques, including: °· Bending your knees. °· Keeping the load close to your body. °· Avoiding twisting. °· Find a comfortable position to sleep. Use a firm mattress and lie on your side with your knees slightly bent. If you lie on your back, put a pillow under your knees. °· Avoid feeling anxious or stressed. Stress increases muscle tension and can worsen back pain. It is important to recognize when you are anxious or stressed and learn ways to manage it, such as with exercise. °· Take medicines only as directed by your health care provider. Over-the-counter  medicines to reduce pain and inflammation are often the most helpful. Your health care provider may prescribe muscle relaxant drugs. These medicines help dull your pain so you can more quickly return to your normal activities and healthy exercise. °· Apply ice to the injured area: °· Put ice in a plastic bag. °· Place a towel between your skin and the bag. °· Leave the ice on for 20 minutes, 2-3 times a day for the first 2-3 days. After that, ice and heat may be alternated to reduce pain and spasms. °· Maintain a healthy weight. Excess weight puts extra stress on your back and makes it difficult to maintain good posture. °SEEK MEDICAL CARE IF: °· You have pain that is not relieved with rest or medicine. °· You have increasing pain going down into the legs or buttocks. °· You have pain that does not improve in one week. °· You have night pain. °· You lose weight. °· You have a fever or chills. °SEEK IMMEDIATE MEDICAL CARE IF:  °· You develop new bowel or bladder control problems. °· You have unusual weakness or numbness in your arms or legs. °· You develop nausea or vomiting. °· You develop abdominal pain. °· You feel faint. °  °This information is not intended to replace advice given to you by your health care provider. Make sure you discuss any questions you have with your health care provider. °  °Document Released: 03/15/2005 Document Revised: 04/05/2014 Document Reviewed: 07/17/2013 °Elsevier Interactive Patient Education ©2016 [employer] °Motor Vehicle Collision °It is common to have multiple bruises and sore muscles after a motor vehicle collision (MVC). These tend to feel worse for the first 24 hours. You may have the most stiffness and soreness over the first several hours. You may also feel worse when you wake up the first morning after your collision. After this point, you will usually begin to improve with each day. The speed of improvement often depends on the severity of the collision, the number of  injuries, and the location and nature of these injuries. °HOME CARE INSTRUCTIONS °· Put ice on the injured area. °· Put ice in a plastic bag. °· Place a towel between your skin and the bag. °· Leave the ice on for 15-20 minutes, 3-4 times a day, or as directed by your health care provider. °· Drink enough fluids to keep your urine clear or pale yellow. Do not drink alcohol. °· Take a warm shower or bath once or twice a day. This will increase blood flow to sore muscles. °· You may return to activities as directed by your caregiver. Be careful when lifting, as this may aggravate neck or back pain. °· Only take over-the-counter or prescription medicines for pain, discomfort, or fever as directed by your caregiver. Do not use aspirin. This may increase bruising and bleeding. °SEEK IMMEDIATE MEDICAL CARE IF: °· You have numbness, tingling, or weakness in the arms or legs. °· You develop severe headaches not relieved with medicine. °· You have severe neck pain, especially tenderness in the middle of the back of your neck. °· You have changes in bowel or bladder control. °· There is increasing pain in any area of the body. °· You have shortness of breath, light-headedness, dizziness, or fainting. °· You have chest pain. °· You feel sick to your stomach (nauseous), throw up (vomit), or sweat. °· You have increasing abdominal discomfort. °· There is blood in your urine, stool, or vomit. °· You have pain in your shoulder (shoulder strap areas). °· You feel your symptoms are getting worse. °MAKE SURE YOU: °· Understand these instructions. °· Will watch your condition. °· Will get help right away if you are not doing well or get worse. °  °This information is not intended to replace advice given to you by your health care provider. Make sure you discuss any questions you have with your health care provider. °  °Document Released: 03/15/2005 Document Revised: 04/05/2014 Document Reviewed: 08/12/2010 °Elsevier Interactive  Patient Education ©2016 [employer]vier Inc. ° °

## 2015-01-03 NOTE — ED Notes (Signed)
Patient transported to CT 

## 2015-01-03 NOTE — ED Notes (Signed)
Pt given written and verbal discharge instructions. Verbalized understanding.

## 2015-02-11 ENCOUNTER — Other Ambulatory Visit: Payer: Self-pay

## 2015-02-11 DIAGNOSIS — Z1231 Encounter for screening mammogram for malignant neoplasm of breast: Secondary | ICD-10-CM

## 2015-03-14 ENCOUNTER — Ambulatory Visit
Admission: RE | Admit: 2015-03-14 | Discharge: 2015-03-14 | Disposition: A | Discharge status: Home / Self Care | Payer: 59 | Source: Ambulatory Visit

## 2015-03-14 DIAGNOSIS — Z1231 Encounter for screening mammogram for malignant neoplasm of breast: Secondary | ICD-10-CM

## 2015-07-31 DIAGNOSIS — N9489 Other specified conditions associated with female genital organs and menstrual cycle: Secondary | ICD-10-CM | POA: Diagnosis not present

## 2015-09-04 DIAGNOSIS — R1084 Generalized abdominal pain: Secondary | ICD-10-CM | POA: Diagnosis not present

## 2015-09-04 DIAGNOSIS — N952 Postmenopausal atrophic vaginitis: Secondary | ICD-10-CM | POA: Diagnosis not present

## 2015-09-04 DIAGNOSIS — R350 Frequency of micturition: Secondary | ICD-10-CM | POA: Diagnosis not present

## 2015-09-09 DIAGNOSIS — K219 Gastro-esophageal reflux disease without esophagitis: Secondary | ICD-10-CM | POA: Diagnosis not present

## 2015-09-09 DIAGNOSIS — R1032 Left lower quadrant pain: Secondary | ICD-10-CM | POA: Diagnosis not present

## 2015-09-09 DIAGNOSIS — K5900 Constipation, unspecified: Secondary | ICD-10-CM | POA: Diagnosis not present

## 2015-09-09 DIAGNOSIS — Z1211 Encounter for screening for malignant neoplasm of colon: Secondary | ICD-10-CM | POA: Diagnosis not present

## 2015-09-09 DIAGNOSIS — Z86010 Personal history of colonic polyps: Secondary | ICD-10-CM | POA: Diagnosis not present

## 2015-09-11 DIAGNOSIS — Z86010 Personal history of colonic polyps: Secondary | ICD-10-CM | POA: Diagnosis not present

## 2015-09-11 DIAGNOSIS — K219 Gastro-esophageal reflux disease without esophagitis: Secondary | ICD-10-CM | POA: Diagnosis not present

## 2015-09-11 DIAGNOSIS — K5900 Constipation, unspecified: Secondary | ICD-10-CM | POA: Diagnosis not present

## 2015-09-11 DIAGNOSIS — Z1211 Encounter for screening for malignant neoplasm of colon: Secondary | ICD-10-CM | POA: Diagnosis not present

## 2015-09-11 DIAGNOSIS — R1032 Left lower quadrant pain: Secondary | ICD-10-CM | POA: Diagnosis not present

## 2015-09-29 DIAGNOSIS — Z803 Family history of malignant neoplasm of breast: Secondary | ICD-10-CM | POA: Diagnosis not present

## 2015-09-29 DIAGNOSIS — Z1379 Encounter for other screening for genetic and chromosomal anomalies: Secondary | ICD-10-CM | POA: Diagnosis not present

## 2015-09-29 DIAGNOSIS — Z808 Family history of malignant neoplasm of other organs or systems: Secondary | ICD-10-CM | POA: Diagnosis not present

## 2015-10-06 DIAGNOSIS — H52222 Regular astigmatism, left eye: Secondary | ICD-10-CM | POA: Diagnosis not present

## 2015-10-06 DIAGNOSIS — H5202 Hypermetropia, left eye: Secondary | ICD-10-CM | POA: Diagnosis not present

## 2015-10-06 DIAGNOSIS — H5201 Hypermetropia, right eye: Secondary | ICD-10-CM | POA: Diagnosis not present

## 2015-10-06 DIAGNOSIS — H524 Presbyopia: Secondary | ICD-10-CM | POA: Diagnosis not present

## 2015-10-06 DIAGNOSIS — H25813 Combined forms of age-related cataract, bilateral: Secondary | ICD-10-CM | POA: Diagnosis not present

## 2015-10-08 DIAGNOSIS — M62838 Other muscle spasm: Secondary | ICD-10-CM | POA: Diagnosis not present

## 2015-10-08 DIAGNOSIS — N9489 Other specified conditions associated with female genital organs and menstrual cycle: Secondary | ICD-10-CM | POA: Diagnosis not present

## 2015-10-08 DIAGNOSIS — R278 Other lack of coordination: Secondary | ICD-10-CM | POA: Diagnosis not present

## 2015-10-08 DIAGNOSIS — M6281 Muscle weakness (generalized): Secondary | ICD-10-CM | POA: Diagnosis not present

## 2015-10-14 DIAGNOSIS — M6281 Muscle weakness (generalized): Secondary | ICD-10-CM | POA: Diagnosis not present

## 2015-10-14 DIAGNOSIS — R278 Other lack of coordination: Secondary | ICD-10-CM | POA: Diagnosis not present

## 2015-10-14 DIAGNOSIS — M62838 Other muscle spasm: Secondary | ICD-10-CM | POA: Diagnosis not present

## 2015-10-14 DIAGNOSIS — N9489 Other specified conditions associated with female genital organs and menstrual cycle: Secondary | ICD-10-CM | POA: Diagnosis not present

## 2015-10-27 DIAGNOSIS — M6281 Muscle weakness (generalized): Secondary | ICD-10-CM | POA: Diagnosis not present

## 2015-10-27 DIAGNOSIS — M62838 Other muscle spasm: Secondary | ICD-10-CM | POA: Diagnosis not present

## 2015-10-27 DIAGNOSIS — N9489 Other specified conditions associated with female genital organs and menstrual cycle: Secondary | ICD-10-CM | POA: Diagnosis not present

## 2015-10-27 DIAGNOSIS — R278 Other lack of coordination: Secondary | ICD-10-CM | POA: Diagnosis not present

## 2015-10-29 DIAGNOSIS — D124 Benign neoplasm of descending colon: Secondary | ICD-10-CM | POA: Diagnosis not present

## 2015-10-29 DIAGNOSIS — K635 Polyp of colon: Secondary | ICD-10-CM | POA: Diagnosis not present

## 2015-10-29 DIAGNOSIS — K5140 Inflammatory polyps of colon without complications: Secondary | ICD-10-CM | POA: Diagnosis not present

## 2015-10-29 DIAGNOSIS — Z1211 Encounter for screening for malignant neoplasm of colon: Secondary | ICD-10-CM | POA: Diagnosis not present

## 2015-10-29 DIAGNOSIS — Z800 Family history of malignant neoplasm of digestive organs: Secondary | ICD-10-CM | POA: Diagnosis not present

## 2015-10-30 DIAGNOSIS — E7800 Pure hypercholesterolemia, unspecified: Secondary | ICD-10-CM | POA: Diagnosis not present

## 2015-11-04 DIAGNOSIS — Z23 Encounter for immunization: Secondary | ICD-10-CM | POA: Diagnosis not present

## 2015-11-04 DIAGNOSIS — I10 Essential (primary) hypertension: Secondary | ICD-10-CM | POA: Diagnosis not present

## 2015-11-04 DIAGNOSIS — E7800 Pure hypercholesterolemia, unspecified: Secondary | ICD-10-CM | POA: Diagnosis not present

## 2015-11-05 DIAGNOSIS — M62838 Other muscle spasm: Secondary | ICD-10-CM | POA: Diagnosis not present

## 2015-11-05 DIAGNOSIS — R278 Other lack of coordination: Secondary | ICD-10-CM | POA: Diagnosis not present

## 2015-11-05 DIAGNOSIS — N9489 Other specified conditions associated with female genital organs and menstrual cycle: Secondary | ICD-10-CM | POA: Diagnosis not present

## 2015-11-05 DIAGNOSIS — M6281 Muscle weakness (generalized): Secondary | ICD-10-CM | POA: Diagnosis not present

## 2015-11-11 DIAGNOSIS — L299 Pruritus, unspecified: Secondary | ICD-10-CM | POA: Diagnosis not present

## 2015-11-17 DIAGNOSIS — M62838 Other muscle spasm: Secondary | ICD-10-CM | POA: Diagnosis not present

## 2015-11-17 DIAGNOSIS — R1031 Right lower quadrant pain: Secondary | ICD-10-CM | POA: Diagnosis not present

## 2015-11-17 DIAGNOSIS — R278 Other lack of coordination: Secondary | ICD-10-CM | POA: Diagnosis not present

## 2015-11-17 DIAGNOSIS — R1032 Left lower quadrant pain: Secondary | ICD-10-CM | POA: Diagnosis not present

## 2015-11-17 DIAGNOSIS — M6281 Muscle weakness (generalized): Secondary | ICD-10-CM | POA: Diagnosis not present

## 2015-12-02 DIAGNOSIS — R278 Other lack of coordination: Secondary | ICD-10-CM | POA: Diagnosis not present

## 2015-12-02 DIAGNOSIS — R1031 Right lower quadrant pain: Secondary | ICD-10-CM | POA: Diagnosis not present

## 2015-12-02 DIAGNOSIS — M6281 Muscle weakness (generalized): Secondary | ICD-10-CM | POA: Diagnosis not present

## 2015-12-02 DIAGNOSIS — R1032 Left lower quadrant pain: Secondary | ICD-10-CM | POA: Diagnosis not present

## 2015-12-02 DIAGNOSIS — M62838 Other muscle spasm: Secondary | ICD-10-CM | POA: Diagnosis not present

## 2015-12-11 DIAGNOSIS — M62838 Other muscle spasm: Secondary | ICD-10-CM | POA: Diagnosis not present

## 2015-12-11 DIAGNOSIS — M6281 Muscle weakness (generalized): Secondary | ICD-10-CM | POA: Diagnosis not present

## 2015-12-11 DIAGNOSIS — R278 Other lack of coordination: Secondary | ICD-10-CM | POA: Diagnosis not present

## 2015-12-11 DIAGNOSIS — R1031 Right lower quadrant pain: Secondary | ICD-10-CM | POA: Diagnosis not present

## 2015-12-11 DIAGNOSIS — R1032 Left lower quadrant pain: Secondary | ICD-10-CM | POA: Diagnosis not present

## 2015-12-18 DIAGNOSIS — K5900 Constipation, unspecified: Secondary | ICD-10-CM | POA: Diagnosis not present

## 2015-12-18 DIAGNOSIS — M6281 Muscle weakness (generalized): Secondary | ICD-10-CM | POA: Diagnosis not present

## 2015-12-18 DIAGNOSIS — M62838 Other muscle spasm: Secondary | ICD-10-CM | POA: Diagnosis not present

## 2015-12-18 DIAGNOSIS — R1031 Right lower quadrant pain: Secondary | ICD-10-CM | POA: Diagnosis not present

## 2015-12-18 DIAGNOSIS — R1032 Left lower quadrant pain: Secondary | ICD-10-CM | POA: Diagnosis not present

## 2015-12-18 DIAGNOSIS — R278 Other lack of coordination: Secondary | ICD-10-CM | POA: Diagnosis not present

## 2015-12-23 DIAGNOSIS — R1031 Right lower quadrant pain: Secondary | ICD-10-CM | POA: Diagnosis not present

## 2015-12-23 DIAGNOSIS — M62838 Other muscle spasm: Secondary | ICD-10-CM | POA: Diagnosis not present

## 2015-12-23 DIAGNOSIS — M6281 Muscle weakness (generalized): Secondary | ICD-10-CM | POA: Diagnosis not present

## 2015-12-23 DIAGNOSIS — R278 Other lack of coordination: Secondary | ICD-10-CM | POA: Diagnosis not present

## 2016-01-13 DIAGNOSIS — M62838 Other muscle spasm: Secondary | ICD-10-CM | POA: Diagnosis not present

## 2016-01-13 DIAGNOSIS — R278 Other lack of coordination: Secondary | ICD-10-CM | POA: Diagnosis not present

## 2016-01-13 DIAGNOSIS — K5900 Constipation, unspecified: Secondary | ICD-10-CM | POA: Diagnosis not present

## 2016-01-13 DIAGNOSIS — R1032 Left lower quadrant pain: Secondary | ICD-10-CM | POA: Diagnosis not present

## 2016-01-13 DIAGNOSIS — M6281 Muscle weakness (generalized): Secondary | ICD-10-CM | POA: Diagnosis not present

## 2016-01-13 DIAGNOSIS — R1031 Right lower quadrant pain: Secondary | ICD-10-CM | POA: Diagnosis not present

## 2016-02-24 ENCOUNTER — Other Ambulatory Visit: Payer: Self-pay | Admitting: Family Medicine

## 2016-02-24 DIAGNOSIS — Z1231 Encounter for screening mammogram for malignant neoplasm of breast: Secondary | ICD-10-CM

## 2016-03-01 DIAGNOSIS — M47814 Spondylosis without myelopathy or radiculopathy, thoracic region: Secondary | ICD-10-CM | POA: Diagnosis not present

## 2016-03-01 DIAGNOSIS — M9902 Segmental and somatic dysfunction of thoracic region: Secondary | ICD-10-CM | POA: Diagnosis not present

## 2016-03-01 DIAGNOSIS — M9901 Segmental and somatic dysfunction of cervical region: Secondary | ICD-10-CM | POA: Diagnosis not present

## 2016-03-01 DIAGNOSIS — M4722 Other spondylosis with radiculopathy, cervical region: Secondary | ICD-10-CM | POA: Diagnosis not present

## 2016-03-02 DIAGNOSIS — M4722 Other spondylosis with radiculopathy, cervical region: Secondary | ICD-10-CM | POA: Diagnosis not present

## 2016-03-02 DIAGNOSIS — M9901 Segmental and somatic dysfunction of cervical region: Secondary | ICD-10-CM | POA: Diagnosis not present

## 2016-03-02 DIAGNOSIS — M47814 Spondylosis without myelopathy or radiculopathy, thoracic region: Secondary | ICD-10-CM | POA: Diagnosis not present

## 2016-03-02 DIAGNOSIS — M9902 Segmental and somatic dysfunction of thoracic region: Secondary | ICD-10-CM | POA: Diagnosis not present

## 2016-03-03 DIAGNOSIS — M9901 Segmental and somatic dysfunction of cervical region: Secondary | ICD-10-CM | POA: Diagnosis not present

## 2016-03-03 DIAGNOSIS — M9902 Segmental and somatic dysfunction of thoracic region: Secondary | ICD-10-CM | POA: Diagnosis not present

## 2016-03-03 DIAGNOSIS — M4722 Other spondylosis with radiculopathy, cervical region: Secondary | ICD-10-CM | POA: Diagnosis not present

## 2016-03-03 DIAGNOSIS — M47814 Spondylosis without myelopathy or radiculopathy, thoracic region: Secondary | ICD-10-CM | POA: Diagnosis not present

## 2016-03-04 DIAGNOSIS — M9901 Segmental and somatic dysfunction of cervical region: Secondary | ICD-10-CM | POA: Diagnosis not present

## 2016-03-04 DIAGNOSIS — M9902 Segmental and somatic dysfunction of thoracic region: Secondary | ICD-10-CM | POA: Diagnosis not present

## 2016-03-04 DIAGNOSIS — M4722 Other spondylosis with radiculopathy, cervical region: Secondary | ICD-10-CM | POA: Diagnosis not present

## 2016-03-04 DIAGNOSIS — M47814 Spondylosis without myelopathy or radiculopathy, thoracic region: Secondary | ICD-10-CM | POA: Diagnosis not present

## 2016-03-08 DIAGNOSIS — M47814 Spondylosis without myelopathy or radiculopathy, thoracic region: Secondary | ICD-10-CM | POA: Diagnosis not present

## 2016-03-08 DIAGNOSIS — M9902 Segmental and somatic dysfunction of thoracic region: Secondary | ICD-10-CM | POA: Diagnosis not present

## 2016-03-08 DIAGNOSIS — M4722 Other spondylosis with radiculopathy, cervical region: Secondary | ICD-10-CM | POA: Diagnosis not present

## 2016-03-08 DIAGNOSIS — M9901 Segmental and somatic dysfunction of cervical region: Secondary | ICD-10-CM | POA: Diagnosis not present

## 2016-03-09 DIAGNOSIS — M9902 Segmental and somatic dysfunction of thoracic region: Secondary | ICD-10-CM | POA: Diagnosis not present

## 2016-03-09 DIAGNOSIS — M9901 Segmental and somatic dysfunction of cervical region: Secondary | ICD-10-CM | POA: Diagnosis not present

## 2016-03-09 DIAGNOSIS — M47814 Spondylosis without myelopathy or radiculopathy, thoracic region: Secondary | ICD-10-CM | POA: Diagnosis not present

## 2016-03-09 DIAGNOSIS — M4722 Other spondylosis with radiculopathy, cervical region: Secondary | ICD-10-CM | POA: Diagnosis not present

## 2016-03-10 DIAGNOSIS — M4722 Other spondylosis with radiculopathy, cervical region: Secondary | ICD-10-CM | POA: Diagnosis not present

## 2016-03-10 DIAGNOSIS — M9902 Segmental and somatic dysfunction of thoracic region: Secondary | ICD-10-CM | POA: Diagnosis not present

## 2016-03-10 DIAGNOSIS — M9901 Segmental and somatic dysfunction of cervical region: Secondary | ICD-10-CM | POA: Diagnosis not present

## 2016-03-10 DIAGNOSIS — M47814 Spondylosis without myelopathy or radiculopathy, thoracic region: Secondary | ICD-10-CM | POA: Diagnosis not present

## 2016-03-19 ENCOUNTER — Ambulatory Visit
Admission: RE | Admit: 2016-03-19 | Discharge: 2016-03-19 | Disposition: A | Discharge status: Home / Self Care | Payer: Medicare Other | Source: Ambulatory Visit | Attending: Family Medicine | Admitting: Family Medicine

## 2016-03-19 DIAGNOSIS — Z1231 Encounter for screening mammogram for malignant neoplasm of breast: Secondary | ICD-10-CM

## 2016-04-01 ENCOUNTER — Ambulatory Visit: Payer: 59

## 2016-04-21 DIAGNOSIS — S4992XA Unspecified injury of left shoulder and upper arm, initial encounter: Secondary | ICD-10-CM | POA: Diagnosis not present

## 2016-04-21 DIAGNOSIS — R42 Dizziness and giddiness: Secondary | ICD-10-CM | POA: Diagnosis not present

## 2016-04-26 ENCOUNTER — Other Ambulatory Visit: Payer: Self-pay | Admitting: Family Medicine

## 2016-04-26 DIAGNOSIS — M25512 Pain in left shoulder: Secondary | ICD-10-CM

## 2016-05-05 DIAGNOSIS — Z113 Encounter for screening for infections with a predominantly sexual mode of transmission: Secondary | ICD-10-CM | POA: Diagnosis not present

## 2016-05-05 DIAGNOSIS — I10 Essential (primary) hypertension: Secondary | ICD-10-CM | POA: Diagnosis not present

## 2016-05-05 DIAGNOSIS — R7301 Impaired fasting glucose: Secondary | ICD-10-CM | POA: Diagnosis not present

## 2016-05-05 DIAGNOSIS — E7800 Pure hypercholesterolemia, unspecified: Secondary | ICD-10-CM | POA: Diagnosis not present

## 2016-05-05 DIAGNOSIS — E559 Vitamin D deficiency, unspecified: Secondary | ICD-10-CM | POA: Diagnosis not present

## 2016-05-06 ENCOUNTER — Other Ambulatory Visit: Payer: Medicare Other

## 2016-05-06 DIAGNOSIS — I10 Essential (primary) hypertension: Secondary | ICD-10-CM | POA: Diagnosis not present

## 2016-05-06 DIAGNOSIS — E7800 Pure hypercholesterolemia, unspecified: Secondary | ICD-10-CM | POA: Diagnosis not present

## 2016-05-06 DIAGNOSIS — M25571 Pain in right ankle and joints of right foot: Secondary | ICD-10-CM | POA: Diagnosis not present

## 2016-05-06 DIAGNOSIS — G8929 Other chronic pain: Secondary | ICD-10-CM | POA: Diagnosis not present

## 2016-05-06 DIAGNOSIS — E2839 Other primary ovarian failure: Secondary | ICD-10-CM | POA: Diagnosis not present

## 2016-05-07 ENCOUNTER — Inpatient Hospital Stay
Admission: RE | Admit: 2016-05-07 | Discharge: 2016-05-07 | Disposition: A | Discharge status: Home / Self Care | Payer: Medicare Other | Source: Ambulatory Visit | Attending: Family Medicine | Admitting: Family Medicine

## 2016-05-12 ENCOUNTER — Other Ambulatory Visit: Payer: Self-pay | Admitting: Family Medicine

## 2016-05-12 DIAGNOSIS — E2839 Other primary ovarian failure: Secondary | ICD-10-CM

## 2016-05-18 ENCOUNTER — Ambulatory Visit
Admission: RE | Admit: 2016-05-18 | Discharge: 2016-05-18 | Disposition: A | Discharge status: Home / Self Care | Payer: Medicare Other | Source: Ambulatory Visit | Attending: Family Medicine | Admitting: Family Medicine

## 2016-05-18 DIAGNOSIS — M25512 Pain in left shoulder: Secondary | ICD-10-CM | POA: Diagnosis not present

## 2016-05-19 ENCOUNTER — Ambulatory Visit
Admission: RE | Admit: 2016-05-19 | Discharge: 2016-05-19 | Disposition: A | Discharge status: Home / Self Care | Payer: PPO | Source: Ambulatory Visit | Attending: Family Medicine | Admitting: Family Medicine

## 2016-05-19 DIAGNOSIS — E2839 Other primary ovarian failure: Secondary | ICD-10-CM

## 2016-05-19 DIAGNOSIS — Z780 Asymptomatic menopausal state: Secondary | ICD-10-CM | POA: Diagnosis not present

## 2016-05-19 DIAGNOSIS — Z13820 Encounter for screening for osteoporosis: Secondary | ICD-10-CM | POA: Diagnosis not present

## 2016-06-09 DIAGNOSIS — S46012A Strain of muscle(s) and tendon(s) of the rotator cuff of left shoulder, initial encounter: Secondary | ICD-10-CM | POA: Diagnosis not present

## 2016-07-07 ENCOUNTER — Encounter (HOSPITAL_COMMUNITY): Payer: Self-pay | Admitting: Obstetrics and Gynecology

## 2016-07-07 ENCOUNTER — Emergency Department (HOSPITAL_COMMUNITY): Payer: PPO

## 2016-07-07 ENCOUNTER — Emergency Department (HOSPITAL_COMMUNITY)
Admission: EM | Admit: 2016-07-07 | Discharge: 2016-07-07 | Disposition: A | Discharge status: Home / Self Care | Payer: PPO | Attending: Emergency Medicine | Admitting: Emergency Medicine

## 2016-07-07 DIAGNOSIS — R5381 Other malaise: Secondary | ICD-10-CM | POA: Diagnosis not present

## 2016-07-07 DIAGNOSIS — R404 Transient alteration of awareness: Secondary | ICD-10-CM | POA: Diagnosis not present

## 2016-07-07 DIAGNOSIS — R55 Syncope and collapse: Secondary | ICD-10-CM | POA: Diagnosis not present

## 2016-07-07 DIAGNOSIS — Z8541 Personal history of malignant neoplasm of cervix uteri: Secondary | ICD-10-CM | POA: Diagnosis not present

## 2016-07-07 DIAGNOSIS — J069 Acute upper respiratory infection, unspecified: Secondary | ICD-10-CM | POA: Diagnosis not present

## 2016-07-07 DIAGNOSIS — R05 Cough: Secondary | ICD-10-CM | POA: Diagnosis not present

## 2016-07-07 DIAGNOSIS — I10 Essential (primary) hypertension: Secondary | ICD-10-CM | POA: Insufficient documentation

## 2016-07-07 DIAGNOSIS — Z79899 Other long term (current) drug therapy: Secondary | ICD-10-CM | POA: Diagnosis not present

## 2016-07-07 DIAGNOSIS — R059 Cough, unspecified: Secondary | ICD-10-CM

## 2016-07-07 DIAGNOSIS — J00 Acute nasopharyngitis [common cold]: Secondary | ICD-10-CM | POA: Diagnosis not present

## 2016-07-07 LAB — URINALYSIS, ROUTINE W REFLEX MICROSCOPIC
BILIRUBIN URINE: NEGATIVE
GLUCOSE, UA: NEGATIVE mg/dL
HGB URINE DIPSTICK: NEGATIVE
KETONES UR: NEGATIVE mg/dL
LEUKOCYTES UA: NEGATIVE
Nitrite: NEGATIVE
PH: 5 (ref 5.0–8.0)
PROTEIN: NEGATIVE mg/dL
Specific Gravity, Urine: 1.009 (ref 1.005–1.030)

## 2016-07-07 LAB — I-STAT TROPONIN, ED: TROPONIN I, POC: 0 ng/mL (ref 0.00–0.08)

## 2016-07-07 LAB — CBC WITH DIFFERENTIAL/PLATELET
BASOS ABS: 0 10*3/uL (ref 0.0–0.1)
BASOS PCT: 0 %
EOS ABS: 0 10*3/uL (ref 0.0–0.7)
Eosinophils Relative: 0 %
HEMATOCRIT: 39.4 % (ref 36.0–46.0)
Hemoglobin: 12.8 g/dL (ref 12.0–15.0)
Lymphocytes Relative: 16 %
Lymphs Abs: 1.5 10*3/uL (ref 0.7–4.0)
MCH: 30.5 pg (ref 26.0–34.0)
MCHC: 32.5 g/dL (ref 30.0–36.0)
MCV: 94 fL (ref 78.0–100.0)
MONO ABS: 1.2 10*3/uL — AB (ref 0.1–1.0)
Monocytes Relative: 13 %
NEUTROS ABS: 6.3 10*3/uL (ref 1.7–7.7)
NEUTROS PCT: 71 %
Platelets: 293 10*3/uL (ref 150–400)
RBC: 4.19 MIL/uL (ref 3.87–5.11)
RDW: 13.9 % (ref 11.5–15.5)
WBC: 8.9 10*3/uL (ref 4.0–10.5)

## 2016-07-07 LAB — COMPREHENSIVE METABOLIC PANEL
ALK PHOS: 75 U/L (ref 38–126)
ALT: 15 U/L (ref 14–54)
ANION GAP: 9 (ref 5–15)
AST: 18 U/L (ref 15–41)
Albumin: 4 g/dL (ref 3.5–5.0)
BILIRUBIN TOTAL: 0.6 mg/dL (ref 0.3–1.2)
BUN: 14 mg/dL (ref 6–20)
CO2: 26 mmol/L (ref 22–32)
Calcium: 9 mg/dL (ref 8.9–10.3)
Chloride: 102 mmol/L (ref 101–111)
Creatinine, Ser: 0.75 mg/dL (ref 0.44–1.00)
GFR calc non Af Amer: >60 mL/min (ref >60)
GLUCOSE: 103 mg/dL — AB (ref 65–99)
Potassium: 3.7 mmol/L (ref 3.5–5.1)
Sodium: 137 mmol/L (ref 135–145)
TOTAL PROTEIN: 7.9 g/dL (ref 6.5–8.1)

## 2016-07-07 LAB — I-STAT CG4 LACTIC ACID, ED: Lactic Acid, Venous: 1.1 mmol/L (ref 0.5–1.9)

## 2016-07-07 MED ORDER — SODIUM CHLORIDE 0.9 % IV BOLUS (SEPSIS)
1000.0000 mL | Freq: Once | INTRAVENOUS | Status: AC
  Administered 2016-07-07: 1000 mL via INTRAVENOUS

## 2016-07-07 MED ORDER — FLUTICASONE PROPIONATE 50 MCG/ACT NA SUSP: 2.0000 | Freq: Every day | NASAL | 0 refills | Status: DC

## 2016-07-07 MED ORDER — BENZONATATE 100 MG PO CAPS: 100.0000 mg | ORAL_CAPSULE | Freq: Three times a day (TID) | ORAL | 0 refills | Status: AC | PRN

## 2016-07-07 NOTE — ED Notes (Signed)
Upon doing orthostatic vital signs patient said she felt dizzy during standing.

## 2016-07-07 NOTE — ED Notes (Signed)
Bed: [facility] Expected date:  Expected time:  Means of arrival:  Comments: 

## 2016-07-07 NOTE — ED Triage Notes (Signed)
PER EMS: Went to Dr. For cough and generalized weakness for 2 says. Passed out after blood draw. Pt states she has fear of needles and she is "orthostatic" per EMS.  Where from: Lives at home  Recent vitals: BP: 92/64 78 HR 16 RR 98.1 Temp CBG 114  20 g in RH  500 NS bolus

## 2016-07-07 NOTE — ED Notes (Signed)
ED Provider at bedside. 

## 2016-07-07 NOTE — ED Provider Notes (Signed)
Medical screening examination/treatment/procedure(s) were conducted as a shared visit with non-physician practitioner(s) and myself.  I personally evaluated the patient during the encounter.  Likely situational syncope.  On my exam, heart rrr no m/r/g. Lungs ctab. Neuro intact. Appears well.  ecg ok.  Stable for dc.    EKG Interpretation  Date/Time:  Wednesday July 07 2016 18:11:20 EDT Ventricular Rate:  75 PR Interval:    QRS Duration: 93 QT Interval:  370 QTC Calculation: 414 R Axis:   148 Text Interpretation:  Right and left arm electrode reversal, interpretation assumes no reversal Sinus rhythm Right axis deviation RSR' in V1 or V2, probably normal variant Abnormal T, consider ischemia, lateral leads Minimal ST elevation, anterior leads Confirmed by [facility] MD, Walter Grima [id number]) on 07/07/2016 6:25:21 PM         Merrily Pew, MD 07/07/16 2224

## 2016-07-07 NOTE — ED Notes (Signed)
Patient given crackers and sprite  

## 2016-07-07 NOTE — ED Notes (Signed)
Patient given sprite.

## 2016-07-07 NOTE — ED Provider Notes (Signed)
[facility] DEPT Provider Note   CSN: [id number] Arrival date & time: 07/07/16  1332     History   Chief Complaint Chief Complaint  Patient presents with  . Loss of Consciousness    HPI Erika Mosley is a 66 y.o. female.  Erika Mosley is a 66 y.o. Female who presents to the ED via EMS after a near syncopal episode at her doctors office today. Patient reports she went to her 71 office today due to 2 days of cough, feeling generally weak and subjective fever yesterday. She reports going to the phlebotomist at her doctor's office and when they were drawn but she felt like she was going to pass out and had a near syncopal episode while sitting the chair. She did not fall or hit her head. She reports feeling positionally lightheaded today. She reports eating and drinking less due to feeling poorly over the past 2 days. She reports subjective fever yesterday, but no fevers today. Patient also reports coughing with some pain in her chest with coughing. Otherwise no pain in her chest. She denies shortness of breath, hemoptysis, leg pain, leg swelling, abdominal pain, nausea, vomiting, rashes, urinary symptoms, headache or dizziness   The history is provided by the patient, medical records and a relative. No language interpreter was used.  Loss of Consciousness   Associated symptoms include fever (subjective ) and light-headedness. Pertinent negatives include abdominal pain, back pain, chest pain, congestion, dizziness, headaches, nausea, palpitations, vomiting and weakness.    Past Medical History:  Diagnosis Date  . Cervical cancer ([place])   . Hyperlipidemia   . Hypertension   . Vertigo     Patient Active Problem List   Diagnosis Date Noted  . Migraine headache without aura 11/03/2012  . Numbness and tingling 11/03/2012  . Left arm pain 11/03/2012  . Neck pain 11/03/2012    Past Surgical History:  Procedure Laterality Date  . ABDOMINAL HYSTERECTOMY      OB  History    No data available       Home Medications    Prior to Admission medications   Medication Sig Start Date End Date Taking? Authorizing Provider  ferrous sulfate (FER-IN-SOL) 75 (15 FE) MG/ML SOLN Take 45 mg by mouth daily as needed (low iron).    Yes Historical Provider, MD  linaclotide (LINZESS) 290 MCG CAPS capsule Take 290 mcg by mouth daily as needed (constipation).  06/28/16  Yes Historical Provider, MD  lisinopril-hydrochlorothiazide (PRINZIDE,ZESTORETIC) 20-12.5 MG tablet Take 1 tablet by mouth every morning. 07/06/16  Yes Historical Provider, MD  meclizine (ANTIVERT) 12.5 MG tablet Take 12.5-25 mg by mouth 3 (three) times daily as needed for dizziness.  05/19/16  Yes Historical Provider, MD  Multiple Vitamin (MULTIVITAMIN WITH MINERALS) TABS tablet Take 1 tablet by mouth daily.   Yes Historical Provider, MD  benzonatate (TESSALON) 100 MG capsule Take 1 capsule (100 mg total) by mouth 3 (three) times daily as needed for cough. 07/07/16   Waynetta Pean, PA-C  fluticasone (FLONASE) 50 MCG/ACT nasal spray Place 2 sprays into both nostrils daily. 07/07/16   Waynetta Pean, PA-C    Family History Family History  Problem Relation Age of Onset  . Cancer Mother   . Cancer Father     Social History Social History  Substance Use Topics  . Smoking status: Never Smoker  . Smokeless tobacco: Never Used  . Alcohol use Yes     Comment: Occassionally - less than qmonthly  Allergies   Patient has no known allergies.   Review of Systems Review of Systems  Constitutional: Positive for appetite change, fatigue and fever (subjective ).  HENT: Positive for rhinorrhea. Negative for congestion, sore throat and trouble swallowing.   Eyes: Negative for visual disturbance.  Respiratory: Positive for cough. Negative for chest tightness, shortness of breath and wheezing.   Cardiovascular: Positive for syncope. Negative for chest pain, palpitations and leg swelling.  Gastrointestinal:  Negative for abdominal pain, diarrhea, nausea and vomiting.  Genitourinary: Negative for difficulty urinating, dysuria and frequency.  Musculoskeletal: Negative for back pain and neck pain.  Skin: Negative for rash.  Neurological: Positive for light-headedness. Negative for dizziness, syncope (near syncope ), weakness, numbness and headaches.     Physical Exam Updated Vital Signs BP (!) 129/53   Pulse 78   Temp 99.4 F (37.4 C) (Oral)   Resp 18   Ht 5\' 4"  (1.626 m)   Wt 77.1 kg   SpO2 93%   BMI 29.18 kg/m   Physical Exam  Constitutional: She is oriented to person, place, and time. She appears well-developed and well-nourished. No distress.  Nontoxic appearing.  HENT:  Head: Normocephalic and atraumatic.  Right Ear: External ear normal.  Left Ear: External ear normal.  Mouth/Throat: Oropharynx is clear and moist.  Eyes: Conjunctivae and EOM are normal. Pupils are equal, round, and reactive to light. Right eye exhibits no discharge. Left eye exhibits no discharge.  Neck: Normal range of motion. Neck supple. No JVD present. No tracheal deviation present.  Cardiovascular: Normal rate, regular rhythm, normal heart sounds and intact distal pulses.  Exam reveals no gallop and no friction rub.   No murmur heard. Pulmonary/Chest: Effort normal and breath sounds normal. No stridor. No respiratory distress. She has no wheezes. She has no rales.  Lungs are clear to ascultation bilaterally. Symmetric chest expansion bilaterally. No increased work of breathing. No rales or rhonchi.    Abdominal: Soft. There is no tenderness.  Musculoskeletal: She exhibits no edema or tenderness.  No lower extremity edema or tenderness.  Lymphadenopathy:    She has no cervical adenopathy.  Neurological: She is alert and oriented to person, place, and time. No cranial nerve deficit or sensory deficit. She exhibits normal muscle tone. Coordination normal.  Patient is alert and oriented 3. Speech is clear and  coherent. Cranial nerves are intact.  Skin: Skin is warm and dry. Capillary refill takes less than 2 seconds. No rash noted. She is not diaphoretic. No erythema. No pallor.  Psychiatric: She has a normal mood and affect. Her behavior is normal.  Nursing note and vitals reviewed.    ED Treatments / Results  Labs (all labs ordered are listed, but only abnormal results are displayed) Labs Reviewed  COMPREHENSIVE METABOLIC PANEL - Abnormal; Notable for the following:       Result Value   Glucose, Bld 103 (*)    All other components within normal limits  CBC WITH DIFFERENTIAL/PLATELET - Abnormal; Notable for the following:    Monocytes Absolute 1.2 (*)    All other components within normal limits  URINALYSIS, ROUTINE W REFLEX MICROSCOPIC - Abnormal; Notable for the following:    Color, Urine STRAW (*)    All other components within normal limits  I-STAT TROPOININ, ED  I-STAT CG4 LACTIC ACID, ED    EKG  EKG Interpretation  Date/Time:  Wednesday July 07 2016 18:11:20 EDT Ventricular Rate:  75 PR Interval:    QRS Duration: 93 QT  Interval:  370 QTC Calculation: 414 R Axis:   148 Text Interpretation:  Right and left arm electrode reversal, interpretation assumes no reversal Sinus rhythm Right axis deviation RSR' in V1 or V2, probably normal variant Abnormal T, consider ischemia, lateral leads Minimal ST elevation, anterior leads Confirmed by [facility] MD, JASON [phone]) on 07/07/2016 6:25:21 PM       Radiology Dg Chest 2 View  Result Date: 07/07/2016 CLINICAL DATA:  Cough and generalized weakness 2 days. EXAM: CHEST  2 VIEW COMPARISON:  None. FINDINGS: Normal cardiac silhouette. Mild bibasilar atelectasis. No effusion, infiltrate, or pneumothorax. No acute osseous abnormality. IMPRESSION: Mild basilar atelectasis.  No pneumonia or edema. Electronically Signed   By: Suzy Bouchard M.D.   On: 07/07/2016 15:50    Procedures Procedures (including critical care time)  Medications  Ordered in ED Medications  sodium chloride 0.9 % bolus 1,000 mL (0 mLs Intravenous Stopped 07/07/16 1802)     Initial Impression / Assessment and Plan / ED Course  I have reviewed the triage vital signs and the nursing notes.  Pertinent labs & imaging results that were available during my care of the patient were reviewed by me and considered in my medical decision making (see chart for details).    This is a 66 y.o. Female who presents to the ED via EMS after a near syncopal episode at her doctors office today. Patient reports she went to her 49 office today due to 2 days of cough, feeling generally weak and subjective fever yesterday. She reports going to the phlebotomist at her doctor's office and when they were drawn but she felt like she was going to pass out and had a near syncopal episode while sitting the chair. She did not fall or hit her head. She reports feeling positionally lightheaded today. She reports eating and drinking less due to feeling poorly over the past 2 days. She reports subjective fever yesterday, but no fevers today. Patient also reports coughing with some pain in her chest with coughing. Otherwise no pain in her chest. She denies shortness of breath.  On exam the patient is afebrile nontoxic appearing. Lungs are clear to auscultation bilaterally. Rhinorrhea is present. No lower extremity edema or tenderness. No focal neurological deficits on exam.  EKG without STEMI at bedside.  Patient does report feeling lightheaded with position change. Will provide a fluid bolus, check blood work and obtain Chest x-ray. Lactic acid is within normal limits. Troponin is not elevated. CMP is unremarkable. CBC is unremarkable. No leukocytosis. Urinalysis is nitrite and leukocyte negative. Chest x-ray shows mild bibasilar atelectasis. No pneumonia or edema. Work appears unremarkable. After fluid bolus patient ambulated to the bathroom without feeling lightheaded or dizzy. She  reports feeling much better. No longer lightheaded with position change on my reevaluation.  Suspect syncope was related to some dehydration and situational syncope with blood draw earlier. Low suspicion for ACS. She is not tachycardiac, hypoxic or tachypnea. I am not concerned for PE with this patient.   Will provide with Flonase and Tessalon Perles for upper respiratory infection. I discussed strict and specific return precautions. I advised the patient to follow-up with their primary care provider this week. I advised the patient to return to the emergency department with new or worsening symptoms or new concerns. The patient verbalized understanding and agreement with plan.    This patient was discussed with and evaluated by Dr. Dayna Barker who agrees with assessment and plan.      Final Clinical  Impressions(s) / ED Diagnoses   Final diagnoses:  Acute nasopharyngitis  Cough  Syncope and collapse    New Prescriptions Discharge Medication List as of 07/07/2016  6:01 PM    START taking these medications   Details  benzonatate (TESSALON) 100 MG capsule Take 1 capsule (100 mg total) by mouth 3 (three) times daily as needed for cough., Starting Wed 07/07/2016, Print    fluticasone (FLONASE) 50 MCG/ACT nasal spray Place 2 sprays into both nostrils daily., Starting Wed 07/07/2016, Print         Waynetta Pean, PA-C 07/07/16 2026    Merrily Pew, MD 07/07/16 4975    Merrily Pew, MD 07/07/16 2224

## 2016-10-04 DIAGNOSIS — S9031XA Contusion of right foot, initial encounter: Secondary | ICD-10-CM | POA: Diagnosis not present

## 2016-10-08 DIAGNOSIS — H5202 Hypermetropia, left eye: Secondary | ICD-10-CM | POA: Diagnosis not present

## 2016-10-08 DIAGNOSIS — H524 Presbyopia: Secondary | ICD-10-CM | POA: Diagnosis not present

## 2016-10-08 DIAGNOSIS — H25813 Combined forms of age-related cataract, bilateral: Secondary | ICD-10-CM | POA: Diagnosis not present

## 2016-10-08 DIAGNOSIS — H52222 Regular astigmatism, left eye: Secondary | ICD-10-CM | POA: Diagnosis not present

## 2016-10-08 DIAGNOSIS — H5201 Hypermetropia, right eye: Secondary | ICD-10-CM | POA: Diagnosis not present

## 2016-10-11 DIAGNOSIS — M19071 Primary osteoarthritis, right ankle and foot: Secondary | ICD-10-CM | POA: Diagnosis not present

## 2016-10-15 DIAGNOSIS — S46092A Other injury of muscle(s) and tendon(s) of the rotator cuff of left shoulder, initial encounter: Secondary | ICD-10-CM | POA: Diagnosis not present

## 2016-10-15 DIAGNOSIS — M75122 Complete rotator cuff tear or rupture of left shoulder, not specified as traumatic: Secondary | ICD-10-CM | POA: Diagnosis not present

## 2016-10-15 DIAGNOSIS — M7542 Impingement syndrome of left shoulder: Secondary | ICD-10-CM | POA: Diagnosis not present

## 2016-10-15 DIAGNOSIS — S43432A Superior glenoid labrum lesion of left shoulder, initial encounter: Secondary | ICD-10-CM | POA: Diagnosis not present

## 2016-10-15 DIAGNOSIS — G8918 Other acute postprocedural pain: Secondary | ICD-10-CM | POA: Diagnosis not present

## 2016-10-15 DIAGNOSIS — M7522 Bicipital tendinitis, left shoulder: Secondary | ICD-10-CM | POA: Diagnosis not present

## 2016-10-15 DIAGNOSIS — M24112 Other articular cartilage disorders, left shoulder: Secondary | ICD-10-CM | POA: Diagnosis not present

## 2016-10-15 DIAGNOSIS — M19012 Primary osteoarthritis, left shoulder: Secondary | ICD-10-CM | POA: Diagnosis not present

## 2016-10-15 DIAGNOSIS — M659 Synovitis and tenosynovitis, unspecified: Secondary | ICD-10-CM | POA: Diagnosis not present

## 2016-10-26 DIAGNOSIS — S9031XA Contusion of right foot, initial encounter: Secondary | ICD-10-CM | POA: Diagnosis not present

## 2016-10-26 DIAGNOSIS — M779 Enthesopathy, unspecified: Secondary | ICD-10-CM | POA: Diagnosis not present

## 2016-10-26 DIAGNOSIS — M2041 Other hammer toe(s) (acquired), right foot: Secondary | ICD-10-CM | POA: Diagnosis not present

## 2016-10-28 DIAGNOSIS — E782 Mixed hyperlipidemia: Secondary | ICD-10-CM | POA: Diagnosis not present

## 2016-10-28 DIAGNOSIS — Z1159 Encounter for screening for other viral diseases: Secondary | ICD-10-CM | POA: Diagnosis not present

## 2016-11-04 DIAGNOSIS — E669 Obesity, unspecified: Secondary | ICD-10-CM | POA: Diagnosis not present

## 2016-11-04 DIAGNOSIS — E7800 Pure hypercholesterolemia, unspecified: Secondary | ICD-10-CM | POA: Diagnosis not present

## 2016-11-04 DIAGNOSIS — I10 Essential (primary) hypertension: Secondary | ICD-10-CM | POA: Diagnosis not present

## 2016-11-19 DIAGNOSIS — Z23 Encounter for immunization: Secondary | ICD-10-CM | POA: Diagnosis not present

## 2016-12-03 DIAGNOSIS — M25512 Pain in left shoulder: Secondary | ICD-10-CM | POA: Diagnosis not present

## 2016-12-07 DIAGNOSIS — M25512 Pain in left shoulder: Secondary | ICD-10-CM | POA: Diagnosis not present

## 2016-12-09 DIAGNOSIS — M25512 Pain in left shoulder: Secondary | ICD-10-CM | POA: Diagnosis not present

## 2016-12-14 DIAGNOSIS — M25512 Pain in left shoulder: Secondary | ICD-10-CM | POA: Diagnosis not present

## 2016-12-16 DIAGNOSIS — M25512 Pain in left shoulder: Secondary | ICD-10-CM | POA: Diagnosis not present

## 2016-12-21 DIAGNOSIS — M25512 Pain in left shoulder: Secondary | ICD-10-CM | POA: Diagnosis not present

## 2016-12-23 DIAGNOSIS — M25512 Pain in left shoulder: Secondary | ICD-10-CM | POA: Diagnosis not present

## 2016-12-28 DIAGNOSIS — M25512 Pain in left shoulder: Secondary | ICD-10-CM | POA: Diagnosis not present

## 2016-12-31 DIAGNOSIS — M25512 Pain in left shoulder: Secondary | ICD-10-CM | POA: Diagnosis not present

## 2017-01-04 DIAGNOSIS — M25512 Pain in left shoulder: Secondary | ICD-10-CM | POA: Diagnosis not present

## 2017-01-06 DIAGNOSIS — M25512 Pain in left shoulder: Secondary | ICD-10-CM | POA: Diagnosis not present

## 2017-01-11 DIAGNOSIS — M25512 Pain in left shoulder: Secondary | ICD-10-CM | POA: Diagnosis not present

## 2017-01-13 DIAGNOSIS — M25512 Pain in left shoulder: Secondary | ICD-10-CM | POA: Diagnosis not present

## 2017-01-18 DIAGNOSIS — M25512 Pain in left shoulder: Secondary | ICD-10-CM | POA: Diagnosis not present

## 2017-01-19 DIAGNOSIS — S46012D Strain of muscle(s) and tendon(s) of the rotator cuff of left shoulder, subsequent encounter: Secondary | ICD-10-CM | POA: Diagnosis not present

## 2017-01-19 DIAGNOSIS — Z4789 Encounter for other orthopedic aftercare: Secondary | ICD-10-CM | POA: Diagnosis not present

## 2017-01-20 DIAGNOSIS — M25512 Pain in left shoulder: Secondary | ICD-10-CM | POA: Diagnosis not present

## 2017-01-25 DIAGNOSIS — M25512 Pain in left shoulder: Secondary | ICD-10-CM | POA: Diagnosis not present

## 2017-01-27 DIAGNOSIS — M25512 Pain in left shoulder: Secondary | ICD-10-CM | POA: Diagnosis not present

## 2017-01-31 DIAGNOSIS — M25512 Pain in left shoulder: Secondary | ICD-10-CM | POA: Diagnosis not present

## 2017-02-02 DIAGNOSIS — M25512 Pain in left shoulder: Secondary | ICD-10-CM | POA: Diagnosis not present

## 2017-02-08 DIAGNOSIS — M25512 Pain in left shoulder: Secondary | ICD-10-CM | POA: Diagnosis not present

## 2017-02-10 DIAGNOSIS — M25512 Pain in left shoulder: Secondary | ICD-10-CM | POA: Diagnosis not present

## 2017-02-15 DIAGNOSIS — M25512 Pain in left shoulder: Secondary | ICD-10-CM | POA: Diagnosis not present

## 2017-02-16 DIAGNOSIS — Z4789 Encounter for other orthopedic aftercare: Secondary | ICD-10-CM | POA: Diagnosis not present

## 2017-02-16 DIAGNOSIS — S46012D Strain of muscle(s) and tendon(s) of the rotator cuff of left shoulder, subsequent encounter: Secondary | ICD-10-CM | POA: Diagnosis not present

## 2017-02-23 ENCOUNTER — Other Ambulatory Visit: Payer: Self-pay | Admitting: Family Medicine

## 2017-02-23 DIAGNOSIS — Z1231 Encounter for screening mammogram for malignant neoplasm of breast: Secondary | ICD-10-CM

## 2017-03-03 DIAGNOSIS — I10 Essential (primary) hypertension: Secondary | ICD-10-CM | POA: Diagnosis not present

## 2017-03-03 DIAGNOSIS — E782 Mixed hyperlipidemia: Secondary | ICD-10-CM | POA: Diagnosis not present

## 2017-03-23 ENCOUNTER — Ambulatory Visit: Payer: PPO

## 2017-03-24 ENCOUNTER — Ambulatory Visit
Admission: RE | Admit: 2017-03-24 | Discharge: 2017-03-24 | Disposition: A | Discharge status: Home / Self Care | Payer: PPO | Source: Ambulatory Visit | Attending: Family Medicine | Admitting: Family Medicine

## 2017-03-24 DIAGNOSIS — Z1231 Encounter for screening mammogram for malignant neoplasm of breast: Secondary | ICD-10-CM

## 2017-05-10 DIAGNOSIS — M47812 Spondylosis without myelopathy or radiculopathy, cervical region: Secondary | ICD-10-CM | POA: Diagnosis not present

## 2017-05-10 DIAGNOSIS — M47816 Spondylosis without myelopathy or radiculopathy, lumbar region: Secondary | ICD-10-CM | POA: Diagnosis not present

## 2017-05-10 DIAGNOSIS — M9903 Segmental and somatic dysfunction of lumbar region: Secondary | ICD-10-CM | POA: Diagnosis not present

## 2017-05-10 DIAGNOSIS — M9901 Segmental and somatic dysfunction of cervical region: Secondary | ICD-10-CM | POA: Diagnosis not present

## 2017-09-02 DIAGNOSIS — R7303 Prediabetes: Secondary | ICD-10-CM | POA: Diagnosis not present

## 2017-09-02 DIAGNOSIS — R7989 Other specified abnormal findings of blood chemistry: Secondary | ICD-10-CM | POA: Diagnosis not present

## 2017-09-02 DIAGNOSIS — Z0000 Encounter for general adult medical examination without abnormal findings: Secondary | ICD-10-CM | POA: Diagnosis not present

## 2017-09-02 DIAGNOSIS — I10 Essential (primary) hypertension: Secondary | ICD-10-CM | POA: Diagnosis not present

## 2017-09-02 DIAGNOSIS — E782 Mixed hyperlipidemia: Secondary | ICD-10-CM | POA: Diagnosis not present

## 2017-09-02 DIAGNOSIS — E785 Hyperlipidemia, unspecified: Secondary | ICD-10-CM | POA: Diagnosis not present

## 2017-10-11 DIAGNOSIS — H5202 Hypermetropia, left eye: Secondary | ICD-10-CM | POA: Diagnosis not present

## 2017-10-11 DIAGNOSIS — H524 Presbyopia: Secondary | ICD-10-CM | POA: Diagnosis not present

## 2017-10-11 DIAGNOSIS — H52222 Regular astigmatism, left eye: Secondary | ICD-10-CM | POA: Diagnosis not present

## 2017-10-11 DIAGNOSIS — H5201 Hypermetropia, right eye: Secondary | ICD-10-CM | POA: Diagnosis not present

## 2017-12-19 DIAGNOSIS — Z0000 Encounter for general adult medical examination without abnormal findings: Secondary | ICD-10-CM | POA: Diagnosis not present

## 2018-03-03 DIAGNOSIS — E782 Mixed hyperlipidemia: Secondary | ICD-10-CM | POA: Diagnosis not present

## 2018-03-03 DIAGNOSIS — I10 Essential (primary) hypertension: Secondary | ICD-10-CM | POA: Diagnosis not present

## 2018-03-09 ENCOUNTER — Other Ambulatory Visit: Payer: Self-pay | Admitting: Family Medicine

## 2018-03-09 DIAGNOSIS — Z1231 Encounter for screening mammogram for malignant neoplasm of breast: Secondary | ICD-10-CM

## 2018-04-19 ENCOUNTER — Ambulatory Visit: Payer: PPO

## 2018-05-24 ENCOUNTER — Ambulatory Visit
Admission: RE | Admit: 2018-05-24 | Discharge: 2018-05-24 | Disposition: A | Discharge status: Home / Self Care | Payer: PPO | Source: Ambulatory Visit | Attending: Family Medicine | Admitting: Family Medicine

## 2018-05-24 DIAGNOSIS — Z1231 Encounter for screening mammogram for malignant neoplasm of breast: Secondary | ICD-10-CM

## 2018-09-21 DIAGNOSIS — R7303 Prediabetes: Secondary | ICD-10-CM | POA: Diagnosis not present

## 2018-09-21 DIAGNOSIS — Z0000 Encounter for general adult medical examination without abnormal findings: Secondary | ICD-10-CM | POA: Diagnosis not present

## 2018-09-21 DIAGNOSIS — R7989 Other specified abnormal findings of blood chemistry: Secondary | ICD-10-CM | POA: Diagnosis not present

## 2018-09-28 DIAGNOSIS — Z1211 Encounter for screening for malignant neoplasm of colon: Secondary | ICD-10-CM | POA: Diagnosis not present

## 2018-09-28 DIAGNOSIS — Z800 Family history of malignant neoplasm of digestive organs: Secondary | ICD-10-CM | POA: Diagnosis not present

## 2018-09-28 DIAGNOSIS — Z86010 Personal history of colonic polyps: Secondary | ICD-10-CM | POA: Diagnosis not present

## 2018-09-28 DIAGNOSIS — K5900 Constipation, unspecified: Secondary | ICD-10-CM | POA: Diagnosis not present

## 2018-09-28 DIAGNOSIS — R140 Abdominal distension (gaseous): Secondary | ICD-10-CM | POA: Diagnosis not present

## 2018-10-30 DIAGNOSIS — Z1211 Encounter for screening for malignant neoplasm of colon: Secondary | ICD-10-CM | POA: Diagnosis not present

## 2018-10-30 DIAGNOSIS — Z86010 Personal history of colonic polyps: Secondary | ICD-10-CM | POA: Diagnosis not present

## 2018-10-30 DIAGNOSIS — Z800 Family history of malignant neoplasm of digestive organs: Secondary | ICD-10-CM | POA: Diagnosis not present

## 2018-10-30 DIAGNOSIS — D124 Benign neoplasm of descending colon: Secondary | ICD-10-CM | POA: Diagnosis not present

## 2018-10-30 DIAGNOSIS — D122 Benign neoplasm of ascending colon: Secondary | ICD-10-CM | POA: Diagnosis not present

## 2018-10-30 DIAGNOSIS — K635 Polyp of colon: Secondary | ICD-10-CM | POA: Diagnosis not present

## 2018-11-13 DIAGNOSIS — Z23 Encounter for immunization: Secondary | ICD-10-CM | POA: Diagnosis not present

## 2018-11-13 DIAGNOSIS — M79642 Pain in left hand: Secondary | ICD-10-CM | POA: Diagnosis not present

## 2018-11-13 DIAGNOSIS — Z0000 Encounter for general adult medical examination without abnormal findings: Secondary | ICD-10-CM | POA: Diagnosis not present

## 2018-11-13 DIAGNOSIS — M25532 Pain in left wrist: Secondary | ICD-10-CM | POA: Diagnosis not present

## 2018-12-04 DIAGNOSIS — Z1159 Encounter for screening for other viral diseases: Secondary | ICD-10-CM | POA: Diagnosis not present

## 2019-04-13 ENCOUNTER — Other Ambulatory Visit: Payer: Self-pay | Admitting: Family Medicine

## 2019-04-13 DIAGNOSIS — Z1231 Encounter for screening mammogram for malignant neoplasm of breast: Secondary | ICD-10-CM

## 2019-05-14 DIAGNOSIS — E782 Mixed hyperlipidemia: Secondary | ICD-10-CM | POA: Diagnosis not present

## 2019-05-14 DIAGNOSIS — R7303 Prediabetes: Secondary | ICD-10-CM | POA: Diagnosis not present

## 2019-05-14 DIAGNOSIS — R42 Dizziness and giddiness: Secondary | ICD-10-CM | POA: Diagnosis not present

## 2019-05-14 DIAGNOSIS — I10 Essential (primary) hypertension: Secondary | ICD-10-CM | POA: Diagnosis not present

## 2019-05-27 ENCOUNTER — Ambulatory Visit: Payer: PPO

## 2019-05-28 ENCOUNTER — Other Ambulatory Visit: Payer: Self-pay

## 2019-05-28 ENCOUNTER — Ambulatory Visit
Admission: RE | Admit: 2019-05-28 | Discharge: 2019-05-28 | Disposition: A | Discharge status: Home / Self Care | Payer: PPO | Source: Ambulatory Visit | Attending: Family Medicine | Admitting: Family Medicine

## 2019-05-28 DIAGNOSIS — Z1231 Encounter for screening mammogram for malignant neoplasm of breast: Secondary | ICD-10-CM

## 2019-06-14 DIAGNOSIS — R42 Dizziness and giddiness: Secondary | ICD-10-CM | POA: Diagnosis not present

## 2019-06-14 DIAGNOSIS — I10 Essential (primary) hypertension: Secondary | ICD-10-CM | POA: Diagnosis not present

## 2019-06-14 DIAGNOSIS — S8391XA Sprain of unspecified site of right knee, initial encounter: Secondary | ICD-10-CM | POA: Diagnosis not present

## 2019-07-17 DIAGNOSIS — M545 Low back pain: Secondary | ICD-10-CM | POA: Diagnosis not present

## 2019-11-14 DIAGNOSIS — E669 Obesity, unspecified: Secondary | ICD-10-CM | POA: Diagnosis not present

## 2019-11-14 DIAGNOSIS — I10 Essential (primary) hypertension: Secondary | ICD-10-CM | POA: Diagnosis not present

## 2019-11-14 DIAGNOSIS — Z0000 Encounter for general adult medical examination without abnormal findings: Secondary | ICD-10-CM | POA: Diagnosis not present

## 2019-11-14 DIAGNOSIS — Z596 Low income: Secondary | ICD-10-CM | POA: Diagnosis not present

## 2019-11-14 DIAGNOSIS — R799 Abnormal finding of blood chemistry, unspecified: Secondary | ICD-10-CM | POA: Diagnosis not present

## 2019-11-14 DIAGNOSIS — E782 Mixed hyperlipidemia: Secondary | ICD-10-CM | POA: Diagnosis not present

## 2019-11-14 DIAGNOSIS — R7303 Prediabetes: Secondary | ICD-10-CM | POA: Diagnosis not present

## 2020-03-10 DIAGNOSIS — R350 Frequency of micturition: Secondary | ICD-10-CM | POA: Diagnosis not present

## 2020-04-15 ENCOUNTER — Other Ambulatory Visit: Payer: Self-pay | Admitting: Family Medicine

## 2020-04-15 DIAGNOSIS — Z1231 Encounter for screening mammogram for malignant neoplasm of breast: Secondary | ICD-10-CM

## 2020-04-21 ENCOUNTER — Ambulatory Visit (HOSPITAL_COMMUNITY)
Admission: EM | Admit: 2020-04-21 | Discharge: 2020-04-21 | Disposition: A | Discharge status: Home / Self Care | Payer: PPO | Attending: Family Medicine | Admitting: Family Medicine

## 2020-04-21 ENCOUNTER — Other Ambulatory Visit: Payer: Self-pay

## 2020-04-21 ENCOUNTER — Encounter (HOSPITAL_COMMUNITY): Payer: Self-pay

## 2020-04-21 DIAGNOSIS — H6982 Other specified disorders of Eustachian tube, left ear: Secondary | ICD-10-CM | POA: Diagnosis not present

## 2020-04-21 DIAGNOSIS — J3089 Other allergic rhinitis: Secondary | ICD-10-CM | POA: Diagnosis not present

## 2020-04-21 MED ORDER — CETIRIZINE HCL 10 MG PO TABS: 10.0000 mg | ORAL_TABLET | Freq: Every day | ORAL | 1 refills | Status: AC

## 2020-04-21 MED ORDER — PREDNISONE 50 MG PO TABS: ORAL_TABLET | ORAL | 0 refills | Status: AC

## 2020-04-21 MED ORDER — FLUTICASONE PROPIONATE 50 MCG/ACT NA SUSP: 2.0000 | Freq: Two times a day (BID) | NASAL | 0 refills | Status: DC

## 2020-04-21 MED ORDER — FLUTICASONE PROPIONATE 50 MCG/ACT NA SUSP: 2.0000 | Freq: Two times a day (BID) | NASAL | 0 refills | Status: AC

## 2020-04-21 NOTE — ED Provider Notes (Signed)
[facility]    CSN: [id number] Arrival date & time: 04/21/20  1306      History   Chief Complaint Chief Complaint  Patient presents with  . Otalgia    HPI Erika Mosley is a 70 y.o. female.   Here today with left sided ear pressure and popping, scratchy throat, sinus drainage for about 4 days now. Denies drainage from ear, fever, chills, muffled hearing, cough. Hx of seasonal allergies, not on consistent antihistamine/allergy regimen. Tried a benadryl once for current sxs, otherwise not trying anything OTC. States this has happened from time to time in the past.      Past Medical History:  Diagnosis Date  . Cervical cancer ([place])   . Hyperlipidemia   . Hypertension   . Vertigo     Patient Active Problem List   Diagnosis Date Noted  . Migraine headache without aura 11/03/2012  . Numbness and tingling 11/03/2012  . Left arm pain 11/03/2012  . Neck pain 11/03/2012    Past Surgical History:  Procedure Laterality Date  . ABDOMINAL HYSTERECTOMY      OB History   No obstetric history on file.      Home Medications    Prior to Admission medications   Medication Sig Start Date End Date Taking? Authorizing Provider  cetirizine (ZYRTEC ALLERGY) 10 MG tablet Take 1 tablet (10 mg total) by mouth daily. 04/21/20  Yes Volney American, PA-C  predniSONE (DELTASONE) 50 MG tablet Take 1 tab daily with breakfast for 3 days 04/21/20  Yes Volney American, PA-C  benzonatate (TESSALON) 100 MG capsule Take 1 capsule (100 mg total) by mouth 3 (three) times daily as needed for cough. 07/07/16   Waynetta Pean, PA-C  ferrous sulfate (FER-IN-SOL) 75 (15 FE) MG/ML SOLN Take 45 mg by mouth daily as needed (low iron).     [provider]  fluticasone (FLONASE) 50 MCG/ACT nasal spray Place 2 sprays into both nostrils in the morning and at bedtime. 04/21/20   Volney American, PA-C  linaclotide [facility]) 290 MCG CAPS capsule Take 290 mcg by mouth daily  as needed (constipation).  06/28/16   [provider]  lisinopril-hydrochlorothiazide (PRINZIDE,ZESTORETIC) 20-12.5 MG tablet Take 1 tablet by mouth every morning. 07/06/16   [provider]  meclizine (ANTIVERT) 12.5 MG tablet Take 12.5-25 mg by mouth 3 (three) times daily as needed for dizziness.  05/19/16   [provider]  Multiple Vitamin (MULTIVITAMIN WITH MINERALS) TABS tablet Take 1 tablet by mouth daily.    [provider]    Family History Family History  Problem Relation Age of Onset  . Cancer Mother   . Cancer Father   . Breast cancer Sister     Social History Social History   Tobacco Use  . Smoking status: Never Smoker  . Smokeless tobacco: Never Used  Substance Use Topics  . Alcohol use: Yes    Comment: Occassionally - less than qmonthly  . Drug use: No     Allergies   Patient has no known allergies.   Review of Systems Review of Systems PER HPI    Physical Exam Triage Vital Signs ED Triage Vitals  Enc Vitals Group     BP 04/21/20 1411 (!) 137/95     Pulse Rate 04/21/20 1411 74     Resp 04/21/20 1411 19     Temp 04/21/20 1411 98.6 F (37 C)     Temp src --  SpO2 04/21/20 1411 95 %     Weight --      Height --      Head Circumference --      Peak Flow --      Pain Score 04/21/20 1409 5     Pain Loc --      Pain Edu? --      Excl. in [place]? --    No data found.  Updated Vital Signs BP (!) 137/95   Pulse 74   Temp 98.6 F (37 C)   Resp 19   SpO2 95%   Visual Acuity Right Eye Distance:   Left Eye Distance:   Bilateral Distance:    Right Eye Near:   Left Eye Near:    Bilateral Near:     Physical Exam Vitals and nursing note reviewed.  Constitutional:      Appearance: Normal appearance. She is not ill-appearing.  HENT:     Head: Atraumatic.     Right Ear: Ear canal and external ear normal.     Left Ear: Ear canal and external ear normal.     Ears:     Comments: B/l middle ear effusion, no TM  erythema, purulence    Nose:     Comments: B/l nasal turbinates boggy and erythematous    Mouth/Throat:     Mouth: Mucous membranes are moist.     Pharynx: Posterior oropharyngeal erythema present. No oropharyngeal exudate.  Eyes:     Extraocular Movements: Extraocular movements intact.     Conjunctiva/sclera: Conjunctivae normal.  Cardiovascular:     Rate and Rhythm: Normal rate and regular rhythm.     Heart sounds: Normal heart sounds.  Pulmonary:     Effort: Pulmonary effort is normal.     Breath sounds: Normal breath sounds.  Musculoskeletal:        General: Normal range of motion.     Cervical back: Normal range of motion and neck supple.  Skin:    General: Skin is warm and dry.  Neurological:     Mental Status: She is alert and oriented to person, place, and time.  Psychiatric:        Mood and Affect: Mood normal.        Thought Content: Thought content normal.        Judgment: Judgment normal.     UC Treatments / Results  Labs (all labs ordered are listed, but only abnormal results are displayed) Labs Reviewed - No data to display  EKG   Radiology No results found.  Procedures Procedures (including critical care time)  Medications Ordered in UC Medications - No data to display  Initial Impression / Assessment and Plan / UC Course  I have reviewed the triage vital signs and the nursing notes.  Pertinent labs & imaging results that were available during my care of the patient were reviewed by me and considered in my medical decision making (see chart for details).     Suspect allergic rhinitis flare causing effusion, will treat with short prednisone burst, start daily zyrtec and flonase regimen, OTC pain relievers prn. Return for acutely worsening sxs.   Final Clinical Impressions(s) / UC Diagnoses   Final diagnoses:  Eustachian tube dysfunction, left  Seasonal allergic rhinitis due to other allergic trigger   Discharge Instructions   None    ED  Prescriptions    Medication Sig Dispense Auth. Provider   fluticasone (FLONASE) 50 MCG/ACT nasal spray  (Status: Discontinued) Place 2 sprays into  both nostrils in the morning and at bedtime. 16 g Volney American, [place]   cetirizine (ZYRTEC ALLERGY) 10 MG tablet Take 1 tablet (10 mg total) by mouth daily. 30 tablet Volney American, [place]   predniSONE (DELTASONE) 50 MG tablet Take 1 tab daily with breakfast for 3 days 3 tablet Volney American, PA-C   fluticasone [facility]) 50 MCG/ACT nasal spray Place 2 sprays into both nostrils in the morning and at bedtime. 16 g Volney American, [place]     PDMP not reviewed this encounter.   Volney American, [place] 04/21/20 1520

## 2020-04-21 NOTE — ED Triage Notes (Signed)
Pt in with c/o left ear pressure that has been going on for 4 days now  Pt states she was taking medicine for pain with minimal relief

## 2020-05-14 DIAGNOSIS — I10 Essential (primary) hypertension: Secondary | ICD-10-CM | POA: Diagnosis not present

## 2020-05-14 DIAGNOSIS — R7303 Prediabetes: Secondary | ICD-10-CM | POA: Diagnosis not present

## 2020-05-14 DIAGNOSIS — E782 Mixed hyperlipidemia: Secondary | ICD-10-CM | POA: Diagnosis not present

## 2020-05-29 ENCOUNTER — Ambulatory Visit: Payer: PPO

## 2020-07-16 ENCOUNTER — Ambulatory Visit: Payer: PPO

## 2020-07-17 ENCOUNTER — Inpatient Hospital Stay: Admission: RE | Admit: 2020-07-17 | Payer: PPO | Source: Ambulatory Visit

## 2020-07-25 DIAGNOSIS — H25813 Combined forms of age-related cataract, bilateral: Secondary | ICD-10-CM | POA: Diagnosis not present

## 2020-07-25 DIAGNOSIS — H43812 Vitreous degeneration, left eye: Secondary | ICD-10-CM | POA: Diagnosis not present

## 2020-07-25 DIAGNOSIS — H04123 Dry eye syndrome of bilateral lacrimal glands: Secondary | ICD-10-CM | POA: Diagnosis not present

## 2020-07-25 DIAGNOSIS — H52223 Regular astigmatism, bilateral: Secondary | ICD-10-CM | POA: Diagnosis not present

## 2020-07-25 DIAGNOSIS — H524 Presbyopia: Secondary | ICD-10-CM | POA: Diagnosis not present

## 2020-07-25 DIAGNOSIS — H5203 Hypermetropia, bilateral: Secondary | ICD-10-CM | POA: Diagnosis not present

## 2020-09-05 ENCOUNTER — Other Ambulatory Visit: Payer: Self-pay

## 2020-09-05 ENCOUNTER — Ambulatory Visit
Admission: RE | Admit: 2020-09-05 | Discharge: 2020-09-05 | Disposition: A | Discharge status: Home / Self Care | Payer: PPO | Source: Ambulatory Visit | Attending: Family Medicine | Admitting: Family Medicine

## 2020-09-05 DIAGNOSIS — Z1231 Encounter for screening mammogram for malignant neoplasm of breast: Secondary | ICD-10-CM

## 2020-11-13 DIAGNOSIS — Z79899 Other long term (current) drug therapy: Secondary | ICD-10-CM | POA: Diagnosis not present

## 2020-11-13 DIAGNOSIS — Z8249 Family history of ischemic heart disease and other diseases of the circulatory system: Secondary | ICD-10-CM | POA: Diagnosis not present

## 2020-11-13 DIAGNOSIS — E782 Mixed hyperlipidemia: Secondary | ICD-10-CM | POA: Diagnosis not present

## 2020-11-13 DIAGNOSIS — R7303 Prediabetes: Secondary | ICD-10-CM | POA: Diagnosis not present

## 2020-11-13 DIAGNOSIS — Z90710 Acquired absence of both cervix and uterus: Secondary | ICD-10-CM | POA: Diagnosis not present

## 2020-11-13 DIAGNOSIS — E7849 Other hyperlipidemia: Secondary | ICD-10-CM | POA: Diagnosis not present

## 2020-11-13 DIAGNOSIS — Z803 Family history of malignant neoplasm of breast: Secondary | ICD-10-CM | POA: Diagnosis not present

## 2020-11-13 DIAGNOSIS — R7989 Other specified abnormal findings of blood chemistry: Secondary | ICD-10-CM | POA: Diagnosis not present

## 2020-11-13 DIAGNOSIS — Z801 Family history of malignant neoplasm of trachea, bronchus and lung: Secondary | ICD-10-CM | POA: Diagnosis not present

## 2020-11-13 DIAGNOSIS — Z0000 Encounter for general adult medical examination without abnormal findings: Secondary | ICD-10-CM | POA: Diagnosis not present

## 2020-11-13 DIAGNOSIS — Z800 Family history of malignant neoplasm of digestive organs: Secondary | ICD-10-CM | POA: Diagnosis not present

## 2020-11-13 DIAGNOSIS — Z833 Family history of diabetes mellitus: Secondary | ICD-10-CM | POA: Diagnosis not present

## 2020-11-13 DIAGNOSIS — Z0001 Encounter for general adult medical examination with abnormal findings: Secondary | ICD-10-CM | POA: Diagnosis not present

## 2020-11-13 DIAGNOSIS — I10 Essential (primary) hypertension: Secondary | ICD-10-CM | POA: Diagnosis not present

## 2020-12-25 DIAGNOSIS — M940 Chondrocostal junction syndrome [Tietze]: Secondary | ICD-10-CM | POA: Diagnosis not present

## 2021-04-18 ENCOUNTER — Ambulatory Visit (HOSPITAL_COMMUNITY)
Admission: EM | Admit: 2021-04-18 | Discharge: 2021-04-18 | Disposition: A | Discharge status: Home / Self Care | Payer: Medicare HMO | Attending: Emergency Medicine | Admitting: Emergency Medicine

## 2021-04-18 ENCOUNTER — Other Ambulatory Visit: Payer: Self-pay

## 2021-04-18 ENCOUNTER — Encounter (HOSPITAL_COMMUNITY): Payer: Self-pay

## 2021-04-18 DIAGNOSIS — M62830 Muscle spasm of back: Secondary | ICD-10-CM

## 2021-04-18 DIAGNOSIS — R109 Unspecified abdominal pain: Secondary | ICD-10-CM | POA: Diagnosis not present

## 2021-04-18 LAB — POCT URINALYSIS DIPSTICK, ED / UC
Bilirubin Urine: NEGATIVE
Glucose, UA: NEGATIVE mg/dL
Ketones, ur: NEGATIVE mg/dL
Leukocytes,Ua: NEGATIVE
Nitrite: NEGATIVE
Protein, ur: NEGATIVE mg/dL
Specific Gravity, Urine: 1.025 (ref 1.005–1.030)
Urobilinogen, UA: 0.2 mg/dL (ref 0.0–1.0)
pH: 5 (ref 5.0–8.0)

## 2021-04-18 MED ORDER — KETOROLAC TROMETHAMINE 60 MG/2ML IM SOLN
60.0000 mg | Freq: Once | INTRAMUSCULAR | Status: AC
  Administered 2021-04-18: 60 mg via INTRAMUSCULAR

## 2021-04-18 MED ORDER — KETOROLAC TROMETHAMINE 60 MG/2ML IM SOLN
INTRAMUSCULAR | Status: AC
  Filled 2021-04-18: qty 2

## 2021-04-18 MED ORDER — BACLOFEN 10 MG PO TABS: 10.0000 mg | ORAL_TABLET | Freq: Every day | ORAL | 0 refills | Status: AC

## 2021-04-18 MED ORDER — NAPROXEN 500 MG PO TABS: 500.0000 mg | ORAL_TABLET | Freq: Two times a day (BID) | ORAL | 0 refills | Status: DC

## 2021-04-18 NOTE — ED Triage Notes (Signed)
Pt presents with increasing back pain for past few weeks.

## 2021-04-18 NOTE — Discharge Instructions (Addendum)
The urinalysis that we performed today was abnormal.  Urine culture will be performed per our protocol.     The results of urine culture should be available in the next 3 to 5 days and will be posted to your MyChart account.  If there are any abnormal results, you will be contacted by phone and any treatment, if needed, will be provided for you.  For your back pain, you were provided with an injection of ketorolac 60 mg in the office today.  This is a potent nonsteroidal anti-inflammatory pain medication that should give you significant pain relief.  Tonight, please take baclofen 10 mg, 1 tablet approximately 1 hour prior to bedtime.  This is a useful muscle relaxer that she will get a good night sleep and allow any spasms in your muscles to relax while you are sleeping.  Tomorrow morning, please begin taking naproxen 500 mg twice daily for your pain as needed.  You have been provided with a note to be out of work for the next few days.  Please avoid any activity that causes you pain, avoid stretching, pushing, pulling, twisting, lifting.

## 2021-04-18 NOTE — ED Provider Notes (Signed)
[place]    CSN: [id number] Arrival date & time: 04/18/21  1709    HISTORY   Chief Complaint  Patient presents with   Back Pain   HPI Erika Mosley is a 71 y.o. female. Patient presents with increasing back pain on her right flank for the past few weeks.  Patient reports a 4 to 84-month history of back pain, states it hurts in various locations.  Patient states the pain she is having in her right flank at this time is acute, tender to palpation.  Patient states she has not done any unusual activities that she is aware of.  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  Patient states she also does water walking, water aerobics and walking exercise, does not do any weight bearing exercise such as lifting weights or Nautilus machines.  Patient denies history of back injury.  Patient denies burning with urination, urinary frequency, urine malodor..  The history is provided by the patient.  Past Medical History:  Diagnosis Date   Cervical cancer ([place])    Hyperlipidemia    Hypertension    Vertigo    Patient Active Problem List   Diagnosis Date Noted   Migraine headache without aura 11/03/2012   Numbness and tingling 11/03/2012   Left arm pain 11/03/2012   Neck pain 11/03/2012   Past Surgical History:  Procedure Laterality Date   ABDOMINAL HYSTERECTOMY     OB History   No obstetric history on file.    Home Medications    Prior to Admission medications   Medication Sig Start Date End Date Taking? Authorizing Provider  benzonatate (TESSALON) 100 MG capsule Take 1 capsule (100 mg total) by mouth 3 (three) times daily as needed for cough. 07/07/16   Waynetta Pean, PA-C  cetirizine (ZYRTEC ALLERGY) 10 MG tablet Take 1 tablet (10 mg total) by mouth daily. 04/21/20   Volney American, PA-C  ferrous sulfate (FER-IN-SOL) 75 (15 FE) MG/ML SOLN Take 45 mg by mouth daily as needed (low iron).     [provider]   fluticasone (FLONASE) 50 MCG/ACT nasal spray Place 2 sprays into both nostrils in the morning and at bedtime. 04/21/20   Volney American, PA-C  linaclotide [facility]) 290 MCG CAPS capsule Take 290 mcg by mouth daily as needed (constipation).  06/28/16   [provider]  lisinopril-hydrochlorothiazide (PRINZIDE,ZESTORETIC) 20-12.5 MG tablet Take 1 tablet by mouth every morning. 07/06/16   [provider]  meclizine (ANTIVERT) 12.5 MG tablet Take 12.5-25 mg by mouth 3 (three) times daily as needed for dizziness.  05/19/16   [provider]  Multiple Vitamin (MULTIVITAMIN WITH MINERALS) TABS tablet Take 1 tablet by mouth daily.    [provider]  predniSONE (DELTASONE) 50 MG tablet Take 1 tab daily with breakfast for 3 days 04/21/20   Volney American, PA-C    Family History Family History  Problem Relation Age of Onset   Cancer Mother    Cancer Father    Breast cancer Sister    Social History Social History   Tobacco Use   Smoking status: Never   Smokeless tobacco: Never  Substance Use Topics   Alcohol use: Yes    Comment: Occassionally - less than qmonthly   Drug use: No   Allergies   Patient has no known allergies.  Review of Systems Review of Systems Pertinent findings noted in history of present illness.  Physical Exam Triage Vital Signs ED Triage Vitals  Enc Vitals Group     BP 01/23/21 0827 (!) 147/82     Pulse Rate 01/23/21 0827 72     Resp 01/23/21 0827 18     Temp 01/23/21 0827 98.3 F (36.8 C)     Temp Source 01/23/21 0827 Oral     SpO2 01/23/21 0827 98 %     Weight --      Height --      Head Circumference --      Peak Flow --      Pain Score 01/23/21 0826 5     Pain Loc --      Pain Edu? --      Excl. in [place]? --   No data found.  Updated Vital Signs BP (!) 163/89 (BP Location: Left Arm) Comment: has not taken BP meds in 2 days   Pulse 86    Temp 98 F (36.7 C) (Oral)    Resp 18    SpO2 100%   Physical  Exam Vitals and nursing note reviewed.  Constitutional:      General: She is not in acute distress.    Appearance: Normal appearance. She is not ill-appearing.  HENT:     Head: Normocephalic and atraumatic.  Eyes:     General: Lids are normal.        Right eye: No discharge.        Left eye: No discharge.     Extraocular Movements: Extraocular movements intact.     Conjunctiva/sclera: Conjunctivae normal.     Right eye: Right conjunctiva is not injected.     Left eye: Left conjunctiva is not injected.  Neck:     Trachea: Trachea and phonation normal.  Cardiovascular:     Rate and Rhythm: Normal rate and regular rhythm.     Pulses: Normal pulses.     Heart sounds: Normal heart sounds. No murmur heard.   No friction rub. No gallop.  Pulmonary:     Effort: Pulmonary effort is normal. No accessory muscle usage, prolonged expiration or respiratory distress.     Breath sounds: Normal breath sounds. No stridor, decreased air movement or transmitted upper airway sounds. No decreased breath sounds, wheezing, rhonchi or rales.  Chest:     Chest wall: No tenderness.  Musculoskeletal:        General: Tenderness present. Normal range of motion.     Cervical back: Normal range of motion and neck supple. Normal range of motion.  Lymphadenopathy:     Cervical: No cervical adenopathy.  Skin:    General: Skin is warm and dry.     Findings: No erythema or rash.  Neurological:     General: No focal deficit present.     Mental Status: She is alert and oriented to person, place, and time.  Psychiatric:        Mood and Affect: Mood normal.        Behavior: Behavior normal.    Visual Acuity Right Eye Distance:   Left Eye Distance:   Bilateral Distance:    Right Eye Near:   Left Eye Near:    Bilateral Near:     UC Couse / Diagnostics / Procedures:    EKG  Radiology No results found.  Procedures Procedures (including critical care time)  UC Diagnoses / Final Clinical  Impressions(s)   I have reviewed the triage vital signs and the nursing notes.  Pertinent labs & imaging results  that were available during my care of the patient were reviewed by me and considered in my medical decision making (see chart for details).    Final diagnoses:  Acute right flank pain  Muscle spasm of back   For immediately relief of pain, patient was provided with a ketorolac injection in the office, this was tolerated well.  I have also advised pt to take Baclofen 10 mg 3 times daily (Patient has been advised that if this makes them sleepy, they can just take this at bedtime, up to 20 mg per dose, and try breaking the tablets in half or 5 mg per dose during the day) and apply topical anti-inflammatory creams such as Voltaren gel, Capsaicin and Aspercreme.   Patient was provided with a note for work.   Urine dip today was positive for microscopic hematuria.  Urine culture will be performed per our protocol.  Patient advised that they will be contacted with results and that adjustments to treatment will be provided as indicated based on the results.   Patient was advised of possibility that urine culture results may be negative if sample provided was obtained late in the day causing urine to be more diluted.  Patient was advised that if antibiotics were effective after the first 24 to 36 hours, despite negative urine culture result, it is recommended that they complete the full course as prescribed.     Return precautions advised.  Drug allergies reviewed, all questions addressed.    ED Prescriptions     Medication Sig Dispense Auth. Provider   baclofen (LIORESAL) 10 MG tablet Take 1 tablet (10 mg total) by mouth at bedtime for 7 days. 7 tablet Lynden Oxford Scales, PA-C   naproxen (NAPROSYN) 500 MG tablet Take 1 tablet (500 mg total) by mouth 2 (two) times daily. 30 tablet Lynden Oxford Scales, PA-C      PDMP not reviewed this encounter.  Pending results:  Labs Reviewed   POCT URINALYSIS DIPSTICK, ED / UC - Abnormal; Notable for the following components:      Result Value   Hgb urine dipstick TRACE (*)    All other components within normal limits  URINE CULTURE    Medications Ordered in UC: Medications  ketorolac (TORADOL) injection 60 mg (60 mg Intramuscular Given 04/18/21 1844)    Disposition Upon Discharge:  Condition: stable for discharge home Home: take medications as prescribed; routine discharge instructions as discussed; follow up as advised.  Patient presented with an acute illness with associated systemic symptoms and significant discomfort requiring urgent management. In my opinion, this is a condition that a prudent lay person (someone who possesses an average knowledge of health and medicine) may potentially expect to result in complications if not addressed urgently such as respiratory distress, impairment of bodily function or dysfunction of bodily organs.   Routine symptom specific, illness specific and/or disease specific instructions were discussed with the patient and/or caregiver at length.   As such, the patient has been evaluated and assessed, work-up was performed and treatment was provided in alignment with urgent care protocols and evidence based medicine.  Patient/parent/caregiver has been advised that the patient may require follow up for further testing and treatment if the symptoms continue in spite of treatment, as clinically indicated and appropriate.  If the patient was tested for COVID-19, Influenza and/or RSV, then the patient/parent/guardian was advised to isolate at home pending the results of his/her diagnostic coronavirus test and potentially longer if theyre positive. I have also advised pt  that if his/her COVID-19 test returns positive, it's recommended to self-isolate for at least 10 days after symptoms first appeared AND until fever-free for 24 hours without fever reducer AND other symptoms have improved or resolved.  Discussed self-isolation recommendations as well as instructions for household member/close contacts as per the [facility] and [place] DHHS, and also gave patient the [employer] [facility] packet with this information.  Patient/parent/caregiver has been advised to return to the [facility] or PCP in 3-5 days if no better; to PCP or the Emergency Department if new signs and symptoms develop, or if the current signs or symptoms continue to change or worsen for further workup, evaluation and treatment as clinically indicated and appropriate  The patient will follow up with their current PCP if and as advised. If the patient does not currently have a PCP we will assist them in obtaining one.   The patient may need specialty follow up if the symptoms continue, in spite of conservative treatment and management, for further workup, evaluation, consultation and treatment as clinically indicated and appropriate.   Patient/parent/caregiver verbalized understanding and agreement of plan as discussed.  All questions were addressed during visit.  Please see discharge instructions below for further details of plan.  Discharge Instructions:   Discharge Instructions      The urinalysis that we performed today was abnormal.  Urine culture will be performed per our protocol.     The results of urine culture should be available in the next 3 to 5 days and will be posted to your MyChart account.  If there are any abnormal results, you will be contacted by phone and any treatment, if needed, will be provided for you.  For your back pain, you were provided with an injection of ketorolac 60 mg in the office today.  This is a potent nonsteroidal anti-inflammatory pain medication that should give you significant pain relief.  Tonight, please take baclofen 10 mg, 1 tablet approximately 1 hour prior to bedtime.  This is a useful muscle relaxer that she will get a good night sleep and allow any spasms in your muscles to relax while you are  sleeping.  Tomorrow morning, please begin taking naproxen 500 mg twice daily for your pain as needed.  You have been provided with a note to be out of work for the next few days.  Please avoid any activity that causes you pain, avoid stretching, pushing, pulling, twisting, lifting.      This office note has been dictated using Museum/gallery curator.  Unfortunately, and despite my best efforts, this method of dictation can sometimes lead to occasional typographical or grammatical errors.  I apologize in advance if this occurs.     Lynden Oxford Scales, PA-C 04/19/21 1029

## 2021-04-20 LAB — URINE CULTURE

## 2021-08-03 ENCOUNTER — Other Ambulatory Visit: Payer: Self-pay | Admitting: Family Medicine

## 2021-08-03 DIAGNOSIS — Z1231 Encounter for screening mammogram for malignant neoplasm of breast: Secondary | ICD-10-CM

## 2021-08-28 ENCOUNTER — Ambulatory Visit
Admission: RE | Admit: 2021-08-28 | Discharge: 2021-08-28 | Disposition: A | Discharge status: Home / Self Care | Payer: PPO | Source: Ambulatory Visit | Attending: Family Medicine | Admitting: Family Medicine

## 2021-08-28 ENCOUNTER — Other Ambulatory Visit: Payer: Self-pay | Admitting: Family Medicine

## 2021-08-28 DIAGNOSIS — M5489 Other dorsalgia: Secondary | ICD-10-CM

## 2021-09-07 ENCOUNTER — Ambulatory Visit
Admission: RE | Admit: 2021-09-07 | Discharge: 2021-09-07 | Disposition: A | Discharge status: Home / Self Care | Payer: PPO | Source: Ambulatory Visit | Attending: Family Medicine | Admitting: Family Medicine

## 2021-09-07 ENCOUNTER — Ambulatory Visit: Payer: Medicare HMO

## 2021-09-07 DIAGNOSIS — Z1231 Encounter for screening mammogram for malignant neoplasm of breast: Secondary | ICD-10-CM

## 2021-11-01 ENCOUNTER — Encounter (HOSPITAL_COMMUNITY): Payer: Self-pay

## 2021-11-01 ENCOUNTER — Ambulatory Visit (INDEPENDENT_AMBULATORY_CARE_PROVIDER_SITE_OTHER): Payer: PPO

## 2021-11-01 ENCOUNTER — Ambulatory Visit (HOSPITAL_COMMUNITY)
Admission: EM | Admit: 2021-11-01 | Discharge: 2021-11-01 | Disposition: A | Discharge status: Home / Self Care | Payer: PPO | Attending: Internal Medicine | Admitting: Internal Medicine

## 2021-11-01 DIAGNOSIS — S92512A Displaced fracture of proximal phalanx of left lesser toe(s), initial encounter for closed fracture: Secondary | ICD-10-CM

## 2021-11-01 DIAGNOSIS — M79672 Pain in left foot: Secondary | ICD-10-CM

## 2021-11-01 MED ORDER — IBUPROFEN 800 MG PO TABS
800.0000 mg | ORAL_TABLET | Freq: Once | ORAL | Status: AC
  Administered 2021-11-01: 800 mg via ORAL

## 2021-11-01 MED ORDER — IBUPROFEN 800 MG PO TABS: 800.0000 mg | ORAL_TABLET | Freq: Three times a day (TID) | ORAL | 0 refills | Status: AC

## 2021-11-01 MED ORDER — IBUPROFEN 800 MG PO TABS
ORAL_TABLET | ORAL | Status: AC
  Filled 2021-11-01: qty 1

## 2021-11-01 NOTE — ED Triage Notes (Signed)
Patient states last night her grand daughter was throwing up. Patient grabbed her granddaughter to run her to the bathroom and hit her left foot.  No bleeding or cuts, did not hear any popping or cracking noises. Patient previously had surgery on this foot.  Hurts to put pressure on the foot, Patient can not wiggle her toes. Patient states she hit the middle and pointer toes, states she felt a jamming sensation. Toes are swollen.

## 2021-11-01 NOTE — ED Provider Notes (Signed)
[place]    CSN: [id number] Arrival date & time: 11/01/21  1407      History   Chief Complaint Chief Complaint  Patient presents with   Foot Injury    HPI Erika Mosley is a 71 y.o. female.   Patient presents to urgent care for evaluation of left foot pain that started this morning.  This morning around 3 AM, she was helping her granddaughter to the bathroom when she stubbed her third toe on an unknown object and fell down.  Patient did not hit her head and is not on blood thinners.  Denies head pain, dizziness, neck pain, arm pain, seizure activity, and loss of consciousness.  She did not get dizzy prior to falling and is not a diabetic.  Pain to the third toe on the left foot is currently an 8 on a scale of 0-10.  This happened at [facility] and patient drove 3 hours back home to [place] straight to urgent care to be seen.  She has not eaten anything today and has not taken any medications for her pain.  She has been ambulating on her left foot without difficulty through the pain.  No numbness or tingling to the bilateral distal lower extremities.  She reports past surgery on the left great toe and second toe.  She has never had surgery to the left third toe.    Foot Injury   Past Medical History:  Diagnosis Date   Cervical cancer ([place])    Hyperlipidemia    Hypertension    Vertigo     Patient Active Problem List   Diagnosis Date Noted   Migraine headache without aura 11/03/2012   Numbness and tingling 11/03/2012   Left arm pain 11/03/2012   Neck pain 11/03/2012    Past Surgical History:  Procedure Laterality Date   ABDOMINAL HYSTERECTOMY      OB History   No obstetric history on file.      Home Medications    Prior to Admission medications   Medication Sig Start Date End Date Taking? Authorizing Provider  ibuprofen (ADVIL) 800 MG tablet Take 1 tablet (800 mg total) by mouth 3 (three) times daily. 11/01/21  Yes Talbot Grumbling, FNP   lisinopril-hydrochlorothiazide (PRINZIDE,ZESTORETIC) 20-12.5 MG tablet Take 1 tablet by mouth every morning. 07/06/16  Yes [provider]  benzonatate (TESSALON) 100 MG capsule Take 1 capsule (100 mg total) by mouth 3 (three) times daily as needed for cough. 07/07/16   Waynetta Pean, PA-C  cetirizine (ZYRTEC ALLERGY) 10 MG tablet Take 1 tablet (10 mg total) by mouth daily. 04/21/20   Volney American, PA-C  ferrous sulfate (FER-IN-SOL) 75 (15 FE) MG/ML SOLN Take 45 mg by mouth daily as needed (low iron).     [provider]  fluticasone (FLONASE) 50 MCG/ACT nasal spray Place 2 sprays into both nostrils in the morning and at bedtime. 04/21/20   Volney American, PA-C  linaclotide [facility]) 290 MCG CAPS capsule Take 290 mcg by mouth daily as needed (constipation).  06/28/16   [provider]  meclizine (ANTIVERT) 12.5 MG tablet Take 12.5-25 mg by mouth 3 (three) times daily as needed for dizziness.  05/19/16   [provider]  Multiple Vitamin (MULTIVITAMIN WITH MINERALS) TABS tablet Take 1 tablet by mouth daily.    [provider]  predniSONE (DELTASONE) 50 MG tablet Take 1 tab daily with breakfast for 3 days 04/21/20   Volney American, PA-C  Family History Family History  Problem Relation Age of Onset   Cancer Mother    Cancer Father    Breast cancer Sister     Social History Social History   Tobacco Use   Smoking status: Never   Smokeless tobacco: Never  Substance Use Topics   Alcohol use: Yes    Comment: Occassionally - less than qmonthly   Drug use: No     Allergies   Patient has no known allergies.   Review of Systems Review of Systems Per HPI  Physical Exam Triage Vital Signs ED Triage Vitals  Enc Vitals Group     BP 11/01/21 1422 (!) 154/79     Pulse Rate 11/01/21 1422 72     Resp 11/01/21 1422 16     Temp 11/01/21 1422 99 F (37.2 C)     Temp Source 11/01/21 1422 Oral     SpO2 11/01/21 1422 94 %      Weight 11/01/21 1423 170 lb (77.1 kg)     Height 11/01/21 1423 '5\' 4"'$  (1.626 m)     Head Circumference --      Peak Flow --      Pain Score 11/01/21 1423 7     Pain Loc --      Pain Edu? --      Excl. in [place]? --    No data found.  Updated Vital Signs BP (!) 154/79 (BP Location: Left Arm)   Pulse 72   Temp 99 F (37.2 C) (Oral)   Resp 16   Ht '5\' 4"'$  (1.626 m)   Wt 170 lb (77.1 kg)   SpO2 94%   BMI 29.18 kg/m   Visual Acuity Right Eye Distance:   Left Eye Distance:   Bilateral Distance:    Right Eye Near:   Left Eye Near:    Bilateral Near:     Physical Exam Vitals and nursing note reviewed.  Constitutional:      Appearance: Normal appearance. She is not ill-appearing or toxic-appearing.     Comments: Very pleasant patient sitting on exam in position of comfort table in no acute distress.   HENT:     Head: Normocephalic and atraumatic.     Right Ear: Hearing and external ear normal.     Left Ear: Hearing and external ear normal.     Nose: Nose normal.     Mouth/Throat:     Lips: Pink.     Mouth: Mucous membranes are moist.  Eyes:     General: Lids are normal. Vision grossly intact. Gaze aligned appropriately.     Extraocular Movements: Extraocular movements intact.     Conjunctiva/sclera: Conjunctivae normal.  Cardiovascular:     Rate and Rhythm: Normal rate and regular rhythm.     Heart sounds: Normal heart sounds, S1 normal and S2 normal.  Pulmonary:     Effort: Pulmonary effort is normal. No respiratory distress.     Breath sounds: Normal breath sounds and air entry.  Abdominal:     Palpations: Abdomen is soft.  Musculoskeletal:     Cervical back: Neck supple.     Comments: Significant tenderness with palpation at the base of the third meta tarsal to the dorsal aspect of the left foot.  Able to move all 5 toes without difficulty on the left foot.  5/5 strength with flexion and extension action of the toes to the left foot.  Normal sensation.  Capillary  refill is less than 3.   Skin:  General: Skin is warm and dry.     Capillary Refill: Capillary refill takes less than 2 seconds.     Findings: No rash.  Neurological:     General: No focal deficit present.     Mental Status: She is alert and oriented to person, place, and time. Mental status is at baseline.     Cranial Nerves: No dysarthria or facial asymmetry.     Gait: Gait is intact.  Psychiatric:        Mood and Affect: Mood normal.        Speech: Speech normal.        Behavior: Behavior normal.        Thought Content: Thought content normal.        Judgment: Judgment normal.      UC Treatments / Results  Labs (all labs ordered are listed, but only abnormal results are displayed) Labs Reviewed - No data to display  EKG   Radiology DG Foot Complete Left  Result Date: 11/01/2021 CLINICAL DATA:  Left foot pain, trauma yesterday EXAM: LEFT FOOT - COMPLETE 3+ VIEW COMPARISON:  None Available. FINDINGS: Frontal, oblique, and lateral views of the left foot are obtained. There is a minimally displaced acute oblique fracture of the third proximal phalanx, with slight lateral angulation at the fracture site. There are no other acute bony abnormalities. Postsurgical changes are seen at the first metatarsal, first proximal phalanx, and second metatarsal. Bony fusion across the second proximal interphalangeal joint. Mild osteoarthritis at the first metatarsophalangeal joint. Soft tissues are grossly unremarkable. IMPRESSION: 1. Acute minimally displaced third proximal phalangeal fracture, with slight lateral angulation at the fracture site. 2. Postsurgical changes as above. 3. Osteoarthritis. Electronically Signed   By: Randa Ngo M.D.   On: 11/01/2021 15:05    Procedures Procedures (including critical care time)  Medications Ordered in UC Medications  ibuprofen (ADVIL) tablet 800 mg (has no administration in time range)    Initial Impression / Assessment and Plan / UC Course  I  have reviewed the triage vital signs and the nursing notes.  Pertinent labs & imaging results that were available during my care of the patient were reviewed by me and considered in my medical decision making (see chart for details).  1.  Closed displaced fracture of the proximal phalanx of the third toe Patient placed in postop shoe and second and third toes were buddy taped to provide stability in the setting of fracture.  Patient given 800 mg of ibuprofen in the clinic for pain relief.  She may take this every 8 hours at home for inflammation and pain with food to avoid stomach upset.  Rest, ice, compression, and elevation recommended over the next few days.  Patient given information for walking referral to orthopedics and advised to call them for an appointment in the next week or so.  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  Work note given.  Advised patient to wear postop shoe until her follow-up appointment with orthopedics.  She is able to ambulate without difficulty and there is no clinical indication for crutches at this time.  Strict ED/urgent care return precautions given.  Patient is agreeable with this plan.   Discussed physical exam and available lab work findings in clinic with patient.  Counseled patient regarding appropriate use of medications and potential side effects for all medications recommended or prescribed today. Discussed red flag  signs and symptoms of worsening condition,when to call the PCP office, return to urgent care, and when to seek higher level of care in the emergency department. Patient verbalizes understanding and agreement with plan. All questions answered. Patient discharged in stable condition.  Final Clinical Impressions(s) / UC Diagnoses   Final diagnoses:  Closed displaced fracture of proximal phalanx of lesser toe of left foot, initial encounter     Discharge Instructions       You have been evaluated in urgent care today for left toe pain. Your evaluation showed a fracture of your third toe on your left foot. We have buddy taped your toes and placed your foot in a post-op shoe. Wear this shoe until your ortho follow-up appointment.   Please rest, ice, and elevate your left toe to help it heal and decrease inflammation.   Take '800mg'$  ibuprofen every 8 hours or tylenol 1,000 every 6 hours as needed for pain.   Your next dose of ibuprofen may be tonight at midnight if needed.  Your next dose of tylenol may be when you get home.  Please follow-up with an orthopedic surgeon in 1 week.  Contact info is on your discharge paperwork. Return to urgent care if you experience worsening pain, numbness, tingling, change of color in your left toes, or any other concerning symptoms.  I hope you feel better!    ED Prescriptions     Medication Sig Dispense Auth. Provider   ibuprofen (ADVIL) 800 MG tablet Take 1 tablet (800 mg total) by mouth 3 (three) times daily. 21 tablet Talbot Grumbling, FNP      PDMP not reviewed this encounter.   Talbot Grumbling, [place] 11/01/21 1556

## 2021-11-01 NOTE — Discharge Instructions (Signed)
You have been evaluated in urgent care today for left toe pain. Your evaluation showed a fracture of your third toe on your left foot. We have buddy taped your toes and placed your foot in a post-op shoe. Wear this shoe until your ortho follow-up appointment.   Please rest, ice, and elevate your left toe to help it heal and decrease inflammation.   Take '800mg'$  ibuprofen every 8 hours or tylenol 1,000 every 6 hours as needed for pain.   Your next dose of ibuprofen may be tonight at midnight if needed.  Your next dose of tylenol may be when you get home.  Please follow-up with an orthopedic surgeon in 1 week.  Contact info is on your discharge paperwork. Return to urgent care if you experience worsening pain, numbness, tingling, change of color in your left toes, or any other concerning symptoms.  I hope you feel better!

## 2022-03-28 ENCOUNTER — Other Ambulatory Visit: Payer: Self-pay

## 2022-03-28 ENCOUNTER — Encounter (HOSPITAL_BASED_OUTPATIENT_CLINIC_OR_DEPARTMENT_OTHER): Payer: Self-pay

## 2022-03-28 ENCOUNTER — Emergency Department (HOSPITAL_BASED_OUTPATIENT_CLINIC_OR_DEPARTMENT_OTHER): Payer: PPO

## 2022-03-28 ENCOUNTER — Emergency Department (HOSPITAL_BASED_OUTPATIENT_CLINIC_OR_DEPARTMENT_OTHER)
Admission: EM | Admit: 2022-03-28 | Discharge: 2022-03-28 | Disposition: A | Discharge status: Home / Self Care | Payer: PPO | Attending: Emergency Medicine | Admitting: Emergency Medicine

## 2022-03-28 DIAGNOSIS — H9201 Otalgia, right ear: Secondary | ICD-10-CM | POA: Diagnosis present

## 2022-03-28 DIAGNOSIS — U071 COVID-19: Secondary | ICD-10-CM | POA: Diagnosis not present

## 2022-03-28 DIAGNOSIS — S0990XA Unspecified injury of head, initial encounter: Secondary | ICD-10-CM | POA: Diagnosis not present

## 2022-03-28 DIAGNOSIS — I10 Essential (primary) hypertension: Secondary | ICD-10-CM | POA: Diagnosis not present

## 2022-03-28 DIAGNOSIS — R7309 Other abnormal glucose: Secondary | ICD-10-CM | POA: Insufficient documentation

## 2022-03-28 DIAGNOSIS — Z23 Encounter for immunization: Secondary | ICD-10-CM | POA: Insufficient documentation

## 2022-03-28 DIAGNOSIS — Z79899 Other long term (current) drug therapy: Secondary | ICD-10-CM | POA: Insufficient documentation

## 2022-03-28 DIAGNOSIS — W19XXXA Unspecified fall, initial encounter: Secondary | ICD-10-CM | POA: Insufficient documentation

## 2022-03-28 DIAGNOSIS — R55 Syncope and collapse: Secondary | ICD-10-CM | POA: Insufficient documentation

## 2022-03-28 DIAGNOSIS — H65191 Other acute nonsuppurative otitis media, right ear: Secondary | ICD-10-CM | POA: Diagnosis not present

## 2022-03-28 LAB — RESP PANEL BY RT-PCR (RSV, FLU A&B, COVID)  RVPGX2
Influenza A by PCR: NEGATIVE
Influenza B by PCR: NEGATIVE
Resp Syncytial Virus by PCR: NEGATIVE
SARS Coronavirus 2 by RT PCR: POSITIVE — AB

## 2022-03-28 LAB — BASIC METABOLIC PANEL
Anion gap: 11 (ref 5–15)
BUN: 14 mg/dL (ref 8–23)
CO2: 28 mmol/L (ref 22–32)
Calcium: 9.4 mg/dL (ref 8.9–10.3)
Chloride: 98 mmol/L (ref 98–111)
Creatinine, Ser: 0.61 mg/dL (ref 0.44–1.00)
GFR, Estimated: >60 mL/min (ref >60)
Glucose, Bld: 109 mg/dL — ABNORMAL HIGH (ref 70–99)
Potassium: 3.9 mmol/L (ref 3.5–5.1)
Sodium: 137 mmol/L (ref 135–145)

## 2022-03-28 LAB — CBC
HCT: 40.8 % (ref 36.0–46.0)
Hemoglobin: 13 g/dL (ref 12.0–15.0)
MCH: 30 pg (ref 26.0–34.0)
MCHC: 31.9 g/dL (ref 30.0–36.0)
MCV: 94 fL (ref 80.0–100.0)
Platelets: 307 10*3/uL (ref 150–400)
RBC: 4.34 MIL/uL (ref 3.87–5.11)
RDW: 14.3 % (ref 11.5–15.5)
WBC: 7.5 10*3/uL (ref 4.0–10.5)
nRBC: 0 % (ref 0.0–0.2)

## 2022-03-28 LAB — CBG MONITORING, ED: Glucose-Capillary: 124 mg/dL — ABNORMAL HIGH (ref 70–99)

## 2022-03-28 MED ORDER — ACETAMINOPHEN 325 MG PO TABS
650.0000 mg | ORAL_TABLET | Freq: Once | ORAL | Status: AC
  Administered 2022-03-28: 650 mg via ORAL
  Filled 2022-03-28: qty 2

## 2022-03-28 MED ORDER — TETANUS-DIPHTH-ACELL PERTUSSIS 5-2.5-18.5 LF-MCG/0.5 IM SUSY
0.5000 mL | PREFILLED_SYRINGE | Freq: Once | INTRAMUSCULAR | Status: AC
  Administered 2022-03-28: 0.5 mL via INTRAMUSCULAR
  Filled 2022-03-28: qty 0.5

## 2022-03-28 MED ORDER — AMOXICILLIN 875 MG PO TABS: 875.0000 mg | ORAL_TABLET | Freq: Two times a day (BID) | ORAL | 0 refills | Status: AC

## 2022-03-28 NOTE — ED Triage Notes (Signed)
Patient here POV from Home.  Endorses, for 2-4 Days, Bilateral Otalgia, Congestion, some Cough.  Also states she had a Syncopal Episode in the Bathroom today this AM at 0500. Unknown Duration; found by Family member. States she became flushed prior to Syncope and went to Bathroom to rinse face with cold water.   NAD Noted during Triage. A&Ox4. GCS 15. Ambulatory.

## 2022-03-28 NOTE — ED Notes (Signed)
Pt verbalized understanding of d/c instructions, meds, and followup care. Denies questions. VSS, no distress noted. Steady gait to exit with all belongings.  ?

## 2022-03-28 NOTE — ED Provider Notes (Signed)
[facility] EMERGENCY DEPT Provider Note   CSN: [id number] Arrival date & time: 03/28/22  1045     History  Chief Complaint  Patient presents with   Otalgia    Erika Mosley is a 71 y.o. female.  Past medical history of hypertension currently on April.  She has about 2-day history of cough and runny nose.  He states that she came in today due to head injury.  She states she was feeling very hot at home, went to the bathroom splashing cold water on her face and was feeling dizzy in the bathroom, she turned to go to the shower and states she woke up on the floor with her grand daughter calling her name.  She thinks this was only approximately 1 to 2 minutes as her granddaughter was woken up by the sound of her falling and she woke up as soon as her granddaughter came in.  She does not take blood thinners, she did notice bleeding from her scalp and has a headache as well and tenderness to her scalp.  She had no palpitations, chest pain, shortness of breath or other associated symptoms prior to passing out she denies nausea vomiting or diarrhea.  He has no numbness tingling or weakness.  She does admit to some mild left lateral neck and trapezius pain after the fall is worse with movement and better with rest.  She is unsure of the date of her last tetanus   Otalgia Associated symptoms: cough        Home Medications Prior to Admission medications   Medication Sig Start Date End Date Taking? Authorizing Provider  amoxicillin (AMOXIL) 875 MG tablet Take 1 tablet (875 mg total) by mouth 2 (two) times daily for 7 days. 03/28/22 04/04/22 Yes Mandel Seiden A, PA-C  benzonatate (TESSALON) 100 MG capsule Take 1 capsule (100 mg total) by mouth 3 (three) times daily as needed for cough. 07/07/16   Waynetta Pean, PA-C  cetirizine (ZYRTEC ALLERGY) 10 MG tablet Take 1 tablet (10 mg total) by mouth daily. 04/21/20   Volney American, PA-C  ferrous sulfate (FER-IN-SOL) 75 (15 FE)  MG/ML SOLN Take 45 mg by mouth daily as needed (low iron).     [provider]  fluticasone (FLONASE) 50 MCG/ACT nasal spray Place 2 sprays into both nostrils in the morning and at bedtime. 04/21/20   Volney American, PA-C  ibuprofen (ADVIL) 800 MG tablet Take 1 tablet (800 mg total) by mouth 3 (three) times daily. 11/01/21   Talbot Grumbling, FNP  linaclotide (LINZESS) 290 MCG CAPS capsule Take 290 mcg by mouth daily as needed (constipation).  06/28/16   [provider]  lisinopril-hydrochlorothiazide (PRINZIDE,ZESTORETIC) 20-12.5 MG tablet Take 1 tablet by mouth every morning. 07/06/16   [provider]  meclizine (ANTIVERT) 12.5 MG tablet Take 12.5-25 mg by mouth 3 (three) times daily as needed for dizziness.  05/19/16   [provider]  Multiple Vitamin (MULTIVITAMIN WITH MINERALS) TABS tablet Take 1 tablet by mouth daily.    [provider]  predniSONE (DELTASONE) 50 MG tablet Take 1 tab daily with breakfast for 3 days 04/21/20   Volney American, PA-C      Allergies    Patient has no known allergies.    Review of Systems   Review of Systems  HENT:  Positive for ear pain.   Respiratory:  Positive for cough.   Neurological:  Positive for syncope and light-headedness.    Physical Exam  Updated Vital Signs BP (!) 99/59 (BP Location: Right Arm)   Pulse (!) 57   Temp 98 F (36.7 C) (Oral)   Resp 16   Ht '5\' 4"'$  (1.626 m)   Wt 77.1 kg   SpO2 96%   BMI 29.18 kg/m  Physical Exam Vitals and nursing note reviewed.  Constitutional:      General: She is not in acute distress.    Appearance: She is well-developed.  HENT:     Head: Normocephalic and atraumatic.     Right Ear: A middle ear effusion is present. No hemotympanum. Tympanic membrane is erythematous. Tympanic membrane is not bulging.     Left Ear: Tympanic membrane normal. No hemotympanum. Tympanic membrane is not bulging.  Eyes:     Conjunctiva/sclera: Conjunctivae  normal.  Neck:     Comments: Tenderness to left lateral neck and left trapezius area no midline tenderness Cardiovascular:     Rate and Rhythm: Normal rate and regular rhythm.     Heart sounds: No murmur heard. Pulmonary:     Effort: Pulmonary effort is normal. No respiratory distress.     Breath sounds: Normal breath sounds.  Abdominal:     Palpations: Abdomen is soft.     Tenderness: There is no abdominal tenderness.  Musculoskeletal:        General: No swelling.     Cervical back: Normal range of motion and neck supple.  Skin:    General: Skin is warm and dry.     Capillary Refill: Capillary refill takes less than 2 seconds.     Comments: Superficial laceration to left occipital area with no gaping, no active bleeding, cleansed with water  Neurological:     Mental Status: She is alert.  Psychiatric:        Mood and Affect: Mood normal.     ED Results / Procedures / Treatments   Labs (all labs ordered are listed, but only abnormal results are displayed) Labs Reviewed  RESP PANEL BY RT-PCR (RSV, FLU A&B, COVID)  RVPGX2 - Abnormal; Notable for the following components:      Result Value   SARS Coronavirus 2 by RT PCR POSITIVE (*)    All other components within normal limits  BASIC METABOLIC PANEL - Abnormal; Notable for the following components:   Glucose, Bld 109 (*)    All other components within normal limits  CBG MONITORING, ED - Abnormal; Notable for the following components:   Glucose-Capillary 124 (*)    All other components within normal limits  CBC    EKG EKG Interpretation  Date/Time:  Sunday March 28 2022 11:04:52 EST Ventricular Rate:  73 PR Interval:  176 QRS Duration: 84 QT Interval:  384 QTC Calculation: 423 R Axis:   35 Text Interpretation: Normal sinus rhythm Cannot rule out Anterior infarct , age undetermined Abnormal ECG When compared with ECG of 07-Jul-2016 18:11, PREVIOUS ECG IS PRESENT No significant change since last tracing Confirmed by  Gareth Morgan [phone]) on 03/28/2022 3:42:36 PM  Radiology CT Head Wo Contrast  Result Date: 03/28/2022 CLINICAL DATA:  Trauma, syncope EXAM: CT HEAD WITHOUT CONTRAST TECHNIQUE: Contiguous axial images were obtained from the base of the skull through the vertex without intravenous contrast. RADIATION DOSE REDUCTION: This exam was performed according to the departmental dose-optimization program which includes automated exposure control, adjustment of the mA and/or kV according to patient size and/or use of iterative reconstruction technique. COMPARISON:  01/03/2015 FINDINGS: Brain: No acute intracranial findings are seen. There  are no signs of bleeding within the cranium. Ventricles are not dilated. Cortical sulci are prominent. There is no focal edema or mass effect. Vascular: Unremarkable. Skull: Unremarkable. Sinuses/Orbits: There is mucosal thickening in ethmoid, sphenoid and left maxillary sinuses. Frontal sinus is hypoplastic. Other: No significant interval changes are noted. IMPRESSION: No acute intracranial findings are seen in noncontrast CT brain. Atrophy. Chronic sinusitis. Electronically Signed   By: Elmer Picker M.D.   On: 03/28/2022 14:09   CT Cervical Spine Wo Contrast  Result Date: 03/28/2022 CLINICAL DATA:  Syncope, trauma, fall EXAM: CT CERVICAL SPINE WITHOUT CONTRAST TECHNIQUE: Multidetector CT imaging of the cervical spine was performed without intravenous contrast. Multiplanar CT image reconstructions were also generated. RADIATION DOSE REDUCTION: This exam was performed according to the departmental dose-optimization program which includes automated exposure control, adjustment of the mA and/or kV according to patient size and/or use of iterative reconstruction technique. COMPARISON:  01/03/2015 FINDINGS: Alignment: Alignment of posterior margins of vertebral bodies is within normal limits. Skull base and vertebrae: No recent fracture is seen. Degenerative changes are noted  in cervical spine and upper thoracic spine. Soft tissues and spinal canal: Posterior bony spurs are causing extrinsic pressure over the ventral margin of thecal sac, more so at C5-C6 and C6-C7 levels with mild spinal stenosis. Disc levels: There is encroachment of neural foramina by bony spurs from C4-T2 levels. Upper chest: Unremarkable. Other: None. IMPRESSION: No recent fracture is seen in cervical spine. Degenerative changes are noted with spinal stenosis and encroachment of neural foramina at multiple levels as described in the body of the report. Electronically Signed   By: Elmer Picker M.D.   On: 03/28/2022 14:07    Procedures Procedures    Medications Ordered in ED Medications  acetaminophen (TYLENOL) tablet 650 mg (650 mg Oral Given 03/28/22 1356)  Tdap (BOOSTRIX) injection 0.5 mL (0.5 mLs Intramuscular Given 03/28/22 1356)    ED Course/ Medical Decision Making/ A&P                           Medical Decision Making This patient presents to the ED for concern of syncope, head injury, recent cough and URI symptoms, this involves an extensive number of treatment options, and is a complaint that carries with it a high risk of complications and morbidity.  The differential diagnosis includes COVID-19, pneumonia, dehydration, URI, vasovagal syncope, cardiac syncope, orthostatic hypotension, other   Co morbidities that complicate the patient evaluation  Hypertension   Additional history obtained:  Additional history obtained from EMR External records from outside source obtained and reviewed including outpatient visit for hypertension   Lab Tests:  I Ordered, and personally interpreted labs.  The pertinent results include: CBC, BMP, COVID flu and RSV testing were all performed and reassuring but patient is positive for COVID-19   Imaging Studies ordered:  I ordered imaging studies including CT head and C-spine I independently visualized and interpreted imaging which  showed degenerative changes of the C-spine, no intracranial hemorrhage, no cervical spine fractures I agree with the radiologist interpretation   Cardiac Monitoring: / EKG:  The patient was maintained on a cardiac monitor.  I personally viewed and interpreted the cardiac monitored which showed an underlying rhythm of: Sinus rhythm, oxygen saturation 98% on room air interpreted as normal by me   Consultations Obtained:  Discussed with ED attending, patient's story very consistent with syncope related to noncardiac cause such as other orthostatic hypotension as she started  feeling dizzy when she stood up quickly to go splashing cold water on her face and then passed out with a quick position change to the bathroom,   Problem List / ED Course / Critical interventions / Medication management  COVID 19-patient reports her symptoms truly started at least 5 days ago, her cough has improved and she is able to tolerate fluids, no specific treatment aside from supportive care, advised on wearing for another 5 days or until negative antigen test at home  Syncope-patient became dizzy after standing up quickly walking to the bathroom and turned quickly to go to the bathroom and had syncopal event that was only 1 to 2 minutes in duration, back to her mental baseline, neurologic exam is very reassuring.  She had no palpitations, she does not have heart failure.  The low risk group for the [facility] syncope rule.  Patient was not orthostatic on her vitals.  She states to get very slightly dizzy on standing.  I offered IV fluids but patient does not want IV.  States she is tolerating fluids fine and wants to continue drinking at home.  She does note that she had been drinking less than usual up until last night but has been trying to drink more.  Mild volume depletion may have predisposed her to orthostatic hypotension causing her syncope.  This time I do not feel the patient needs to be admitted.  She would  prefer to go home as well.  I discussed strict return precautions, has follow-up with her PCP.  We discussed that if she has repeated syncope event she needs to come back and would like need to be admitted at that time.  Closed injury-patient had a fall injury.  She had some bleeding but there is no repairable laceration.  She does have some superficial cuts that do not require repair.  Tetanus was updated today.  CT of head and C-spine were performed and showed no acute fracture, no intracranial hemorrhage.  Patient advised on rest at home, Tylenol as needed for pain and strict return precautions if she would develop severe headache vomiting or any other worrisome changes I ordered medication including Tylenol for pain Reevaluation of the patient after these medicines showed that the patient improved I have reviewed the patients home medicines and have made adjustments as needed  Test / Admission - Considered:  Consider troponin but patient no ECG changes noted, no chest pain or features suggestive that this is cardiac syncope or that she is having ACS, not indicated at this time.    Amount and/or Complexity of Data Reviewed Labs: ordered. Radiology: ordered.  Risk OTC drugs. Prescription drug management.           Final Clinical Impression(s) / ED Diagnoses Final diagnoses:  COVID-19  Syncope, unspecified syncope type  Head injury, closed, initial encounter  Other non-recurrent acute nonsuppurative otitis media of right ear    Rx / DC Orders ED Discharge Orders          Ordered    amoxicillin (AMOXIL) 875 MG tablet  2 times daily        03/28/22 [address] PA-C 03/28/22 1546    Gareth Morgan, MD 03/28/22 2256

## 2022-03-28 NOTE — Discharge Instructions (Addendum)
Today after passing out.  You have COVID-19.  This is thought to be likely due to drop in blood pressure from standing up quickly and being slightly dehydrated.  Keep drinking lots of fluids at home.  Your tests were all reassuring.  You did hit your head, you take Tylenol as needed for headache.  If you start having worsening headache, vomiting or worrisome changes otherwise come back to the ER immediately.  Follow-up close with your primary care doctor.  You do appear to have a right ear infection.  You are given prescription for antibiotics.  EKG these right away or wait another day to see if your pain continues or see if it resolves

## 2022-08-30 ENCOUNTER — Other Ambulatory Visit: Payer: Self-pay | Admitting: Family Medicine

## 2022-08-30 DIAGNOSIS — Z1231 Encounter for screening mammogram for malignant neoplasm of breast: Secondary | ICD-10-CM

## 2022-09-08 IMAGING — MG MM DIGITAL SCREENING BILAT W/ TOMO AND CAD
6 of 11 series · 6 of 35 positions shown · non-contrast
Comparison: Previous exam(s).

CLINICAL DATA: Screening.

EXAM:
DIGITAL SCREENING BILATERAL MAMMOGRAM WITH TOMOSYNTHESIS AND CAD
TECHNIQUE: Bilateral screening digital craniocaudal and mediolateral oblique
mammograms were obtained. Bilateral screening digital breast
tomosynthesis was performed. The images were evaluated with
computer-aided detection.

[L CC synth-2D (1 of 2)]
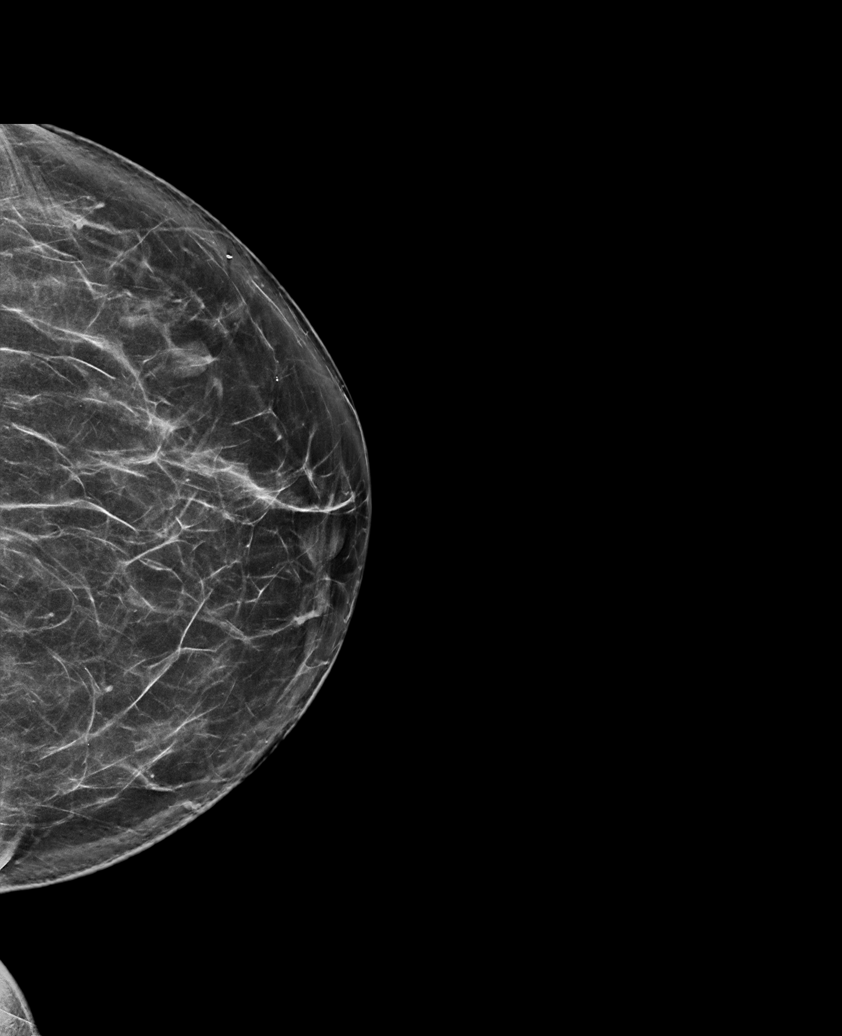

[R CC synth-2D]
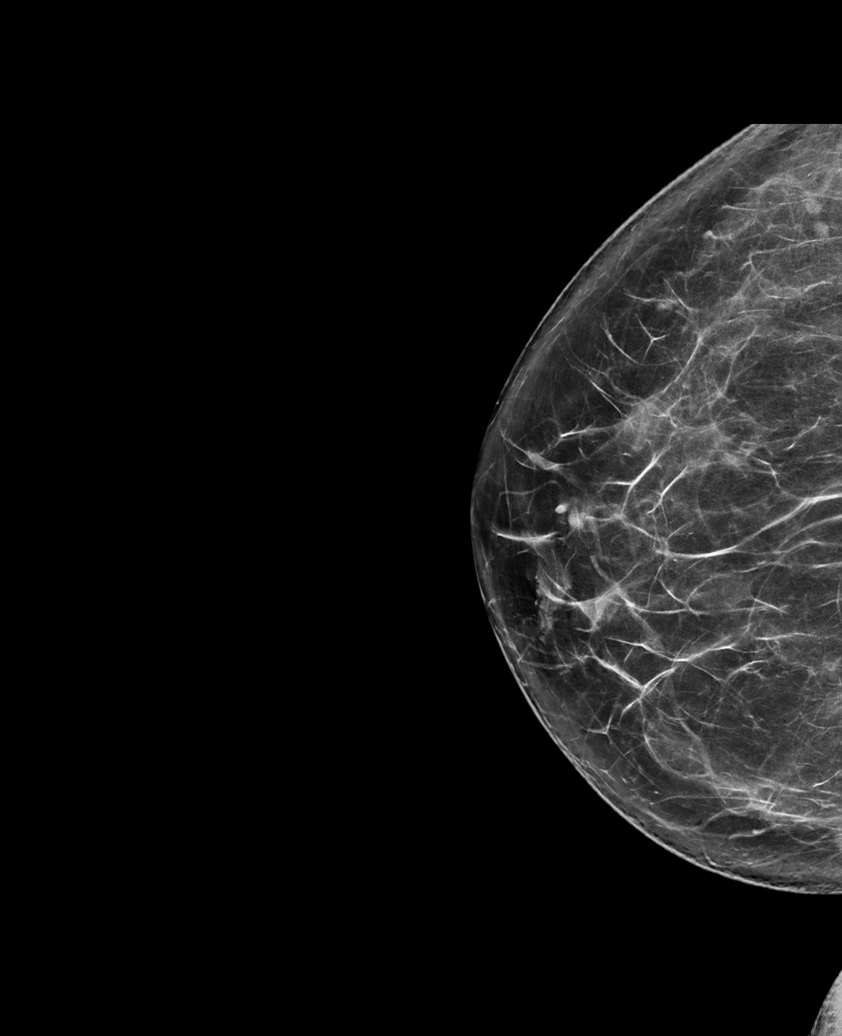

[L MLO synth-2D]
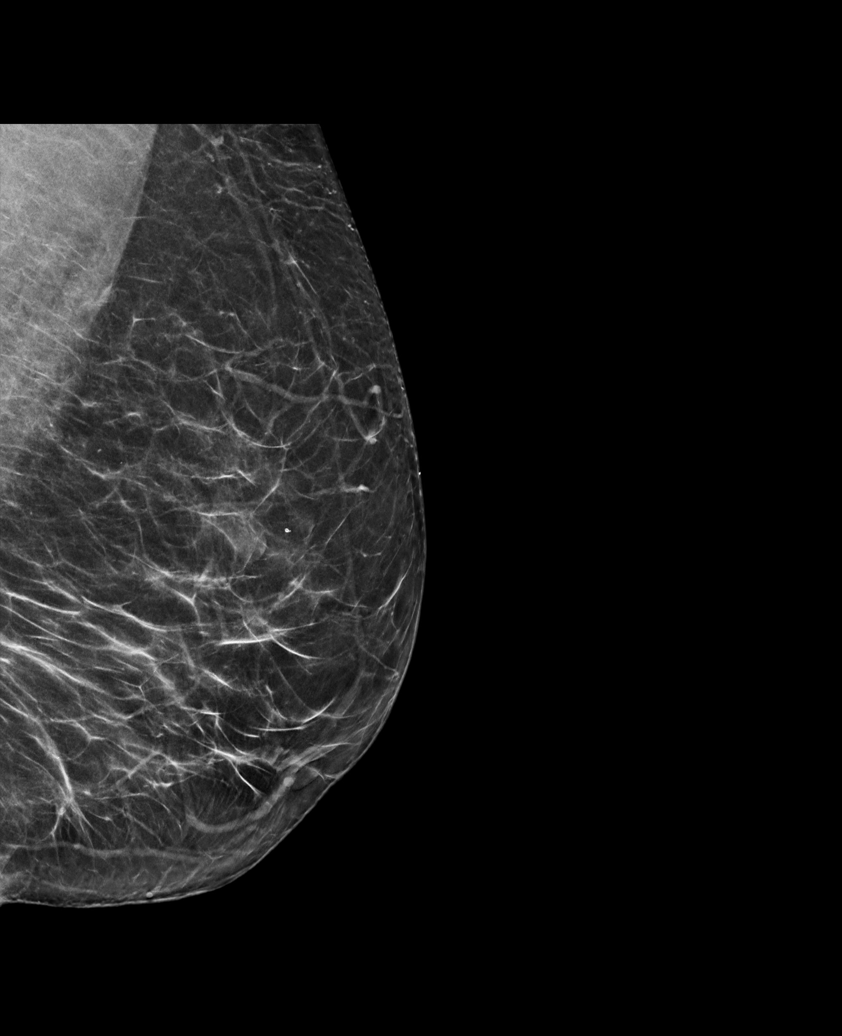

[R MLO synth-2D]
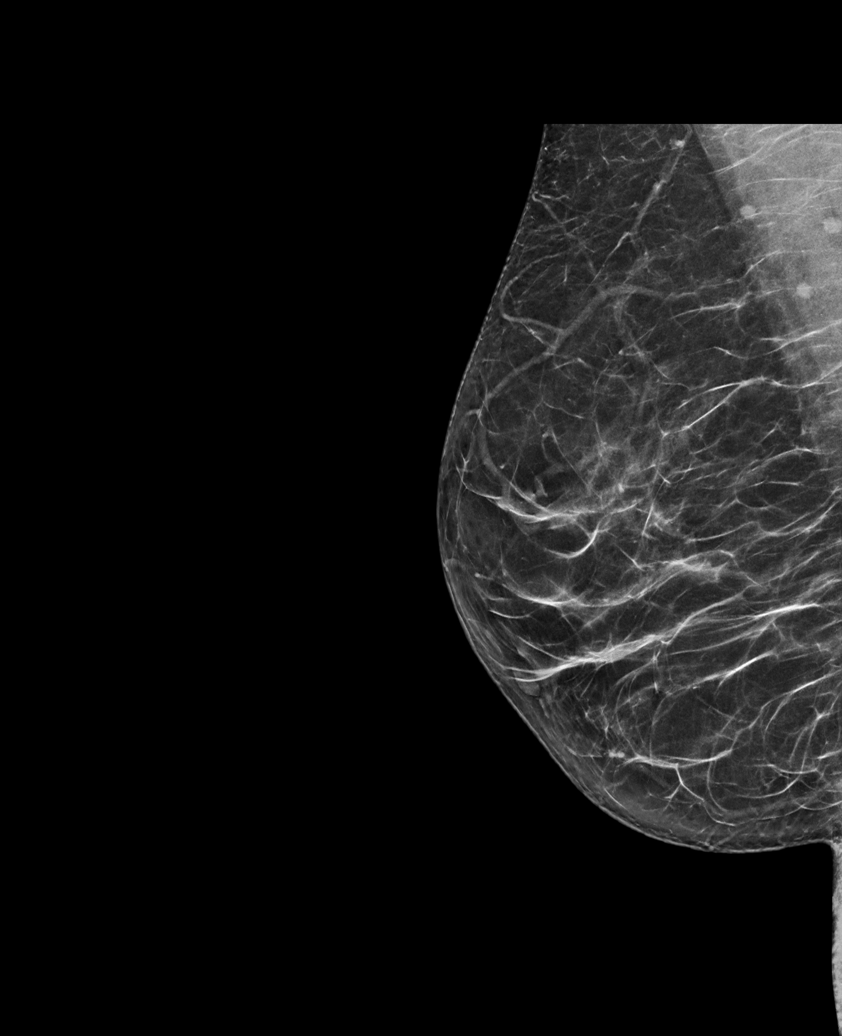

[L CC synth-2D (2 of 2)]
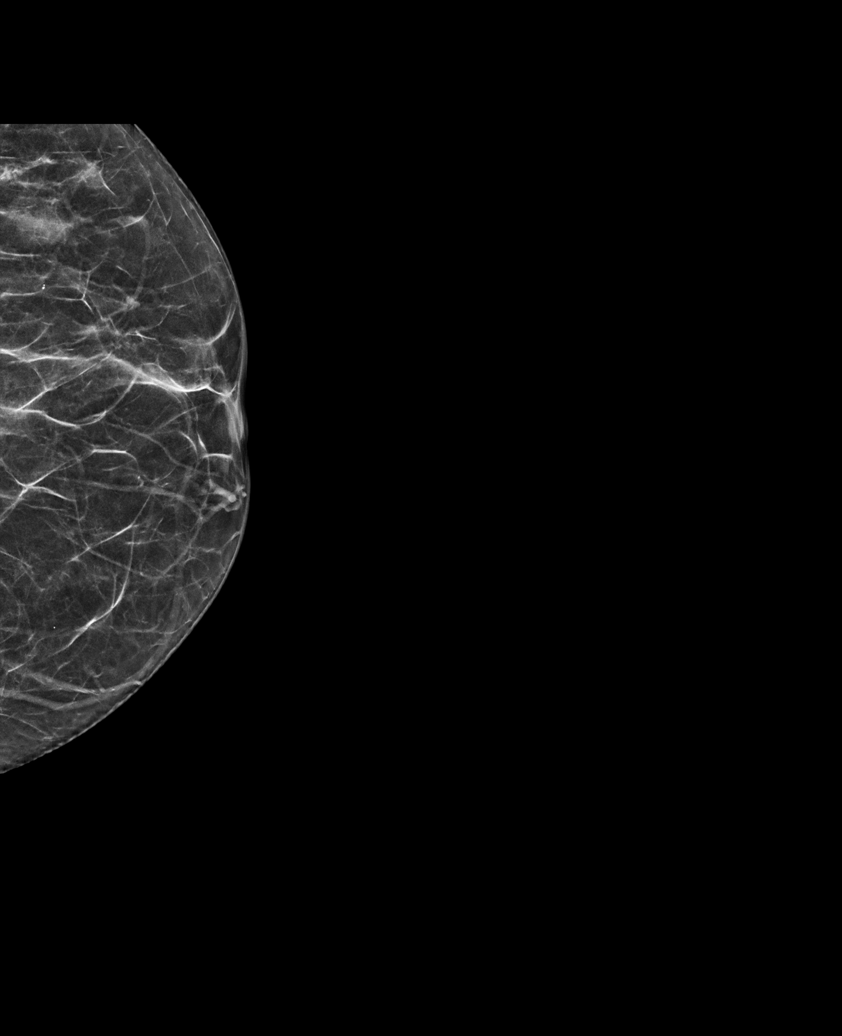

[L MLO tomo · tomo slice 37/72.0]
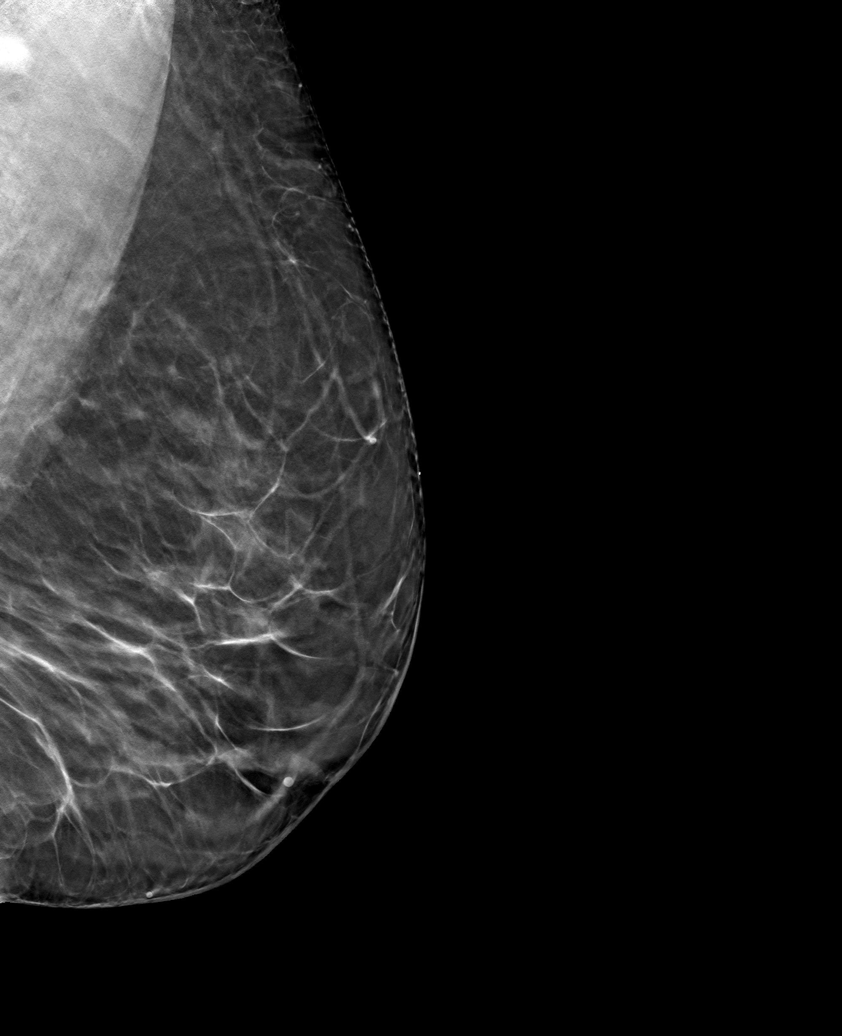

[6 of 35 positions shown; findings below may reference images not displayed]

ACR Breast Density Category b: There are scattered areas of
fibroglandular density.
FINDINGS: There are no findings suspicious for malignancy.
IMPRESSION: No mammographic evidence of malignancy. A result letter of this
screening mammogram will be mailed directly to the patient.

RECOMMENDATION:
Screening mammogram in one year. (Code:51-O-LD2)

BI-RADS CATEGORY  1: Negative.

## 2022-09-22 ENCOUNTER — Ambulatory Visit
Admission: RE | Admit: 2022-09-22 | Discharge: 2022-09-22 | Disposition: A | Discharge status: Home / Self Care | Payer: Medicare Other | Source: Ambulatory Visit | Attending: Family Medicine | Admitting: Family Medicine

## 2022-09-22 DIAGNOSIS — Z1231 Encounter for screening mammogram for malignant neoplasm of breast: Secondary | ICD-10-CM

## 2022-11-01 ENCOUNTER — Encounter (HOSPITAL_BASED_OUTPATIENT_CLINIC_OR_DEPARTMENT_OTHER): Payer: Self-pay | Admitting: Emergency Medicine

## 2022-11-01 ENCOUNTER — Emergency Department (HOSPITAL_BASED_OUTPATIENT_CLINIC_OR_DEPARTMENT_OTHER): Payer: Medicare Other

## 2022-11-01 ENCOUNTER — Emergency Department (HOSPITAL_BASED_OUTPATIENT_CLINIC_OR_DEPARTMENT_OTHER)
Admission: EM | Admit: 2022-11-01 | Discharge: 2022-11-02 | Disposition: A | Discharge status: Home / Self Care | Payer: Medicare Other | Attending: Emergency Medicine | Admitting: Emergency Medicine

## 2022-11-01 DIAGNOSIS — Z8541 Personal history of malignant neoplasm of cervix uteri: Secondary | ICD-10-CM | POA: Insufficient documentation

## 2022-11-01 DIAGNOSIS — Z79899 Other long term (current) drug therapy: Secondary | ICD-10-CM | POA: Diagnosis not present

## 2022-11-01 DIAGNOSIS — R197 Diarrhea, unspecified: Secondary | ICD-10-CM | POA: Insufficient documentation

## 2022-11-01 DIAGNOSIS — R1084 Generalized abdominal pain: Secondary | ICD-10-CM | POA: Insufficient documentation

## 2022-11-01 DIAGNOSIS — I10 Essential (primary) hypertension: Secondary | ICD-10-CM | POA: Insufficient documentation

## 2022-11-01 DIAGNOSIS — R109 Unspecified abdominal pain: Secondary | ICD-10-CM | POA: Diagnosis present

## 2022-11-01 LAB — COMPREHENSIVE METABOLIC PANEL
ALT: 9 U/L (ref 0–44)
AST: 16 U/L (ref 15–41)
Albumin: 4.2 g/dL (ref 3.5–5.0)
Alkaline Phosphatase: 70 U/L (ref 38–126)
Anion gap: 9 (ref 5–15)
BUN: 11 mg/dL (ref 8–23)
CO2: 26 mmol/L (ref 22–32)
Calcium: 9.2 mg/dL (ref 8.9–10.3)
Chloride: 102 mmol/L (ref 98–111)
Creatinine, Ser: 0.55 mg/dL (ref 0.44–1.00)
GFR, Estimated: >60 mL/min (ref >60)
Glucose, Bld: 93 mg/dL (ref 70–99)
Potassium: 3.5 mmol/L (ref 3.5–5.1)
Sodium: 137 mmol/L (ref 135–145)
Total Bilirubin: 0.5 mg/dL (ref 0.3–1.2)
Total Protein: 7.5 g/dL (ref 6.5–8.1)

## 2022-11-01 LAB — CBC
HCT: 40.3 % (ref 36.0–46.0)
Hemoglobin: 13.1 g/dL (ref 12.0–15.0)
MCH: 30.2 pg (ref 26.0–34.0)
MCHC: 32.5 g/dL (ref 30.0–36.0)
MCV: 92.9 fL (ref 80.0–100.0)
Platelets: 316 10*3/uL (ref 150–400)
RBC: 4.34 MIL/uL (ref 3.87–5.11)
RDW: 13.6 % (ref 11.5–15.5)
WBC: 7 10*3/uL (ref 4.0–10.5)
nRBC: 0 % (ref 0.0–0.2)

## 2022-11-01 LAB — LIPASE, BLOOD: Lipase: 25 U/L (ref 11–51)

## 2022-11-01 MED ORDER — IOHEXOL 300 MG/ML  SOLN
100.0000 mL | Freq: Once | INTRAMUSCULAR | Status: AC | PRN
  Administered 2022-11-01: 80 mL via INTRAVENOUS

## 2022-11-01 NOTE — Discharge Instructions (Signed)
It was a pleasure caring for you today. Seek emergency care if experiencing any new or worsening symptoms.

## 2022-11-01 NOTE — ED Triage Notes (Signed)
Abdo pain since Friday. Cramping, pains nausea, some diarrhea today "Stomach feels hot, that's why I keep an ice pack on it" Ice helps with pain  Seen at [facility] sent for work up, aapy vs diverticulitis vs obstruction

## 2022-11-01 NOTE — ED Provider Notes (Signed)
[place] EMERGENCY DEPARTMENT AT [facility] Provider Note   CSN: [id number] Arrival date & time: 11/01/22  2144     History {Add pertinent medical, surgical, social history, OB history to HPI:1} Chief Complaint  Patient presents with   Abdominal Pain    Erika Mosley is a 72 y.o. female with PMHx cervical cancer, HLD, HTN, vertigo who presents to ED concerned for cramping abdominal pain x4 days. Also complaining of nausea and diarrhea x1 day.   Denies fever, cough, dyspnea, hematochezia, vomiting. Denies new medications, recent travel, drinking unsafe water, bug bite/rashes.   Abdominal Pain      Home Medications Prior to Admission medications   Medication Sig Start Date End Date Taking? Authorizing Provider  benzonatate (TESSALON) 100 MG capsule Take 1 capsule (100 mg total) by mouth 3 (three) times daily as needed for cough. 07/07/16   Everlene Farrier, PA-C  cetirizine (ZYRTEC ALLERGY) 10 MG tablet Take 1 tablet (10 mg total) by mouth daily. 04/21/20   Particia Nearing, PA-C  ferrous sulfate (FER-IN-SOL) 75 (15 FE) MG/ML SOLN Take 45 mg by mouth daily as needed (low iron).     [provider]  fluticasone (FLONASE) 50 MCG/ACT nasal spray Place 2 sprays into both nostrils in the morning and at bedtime. 04/21/20   Particia Nearing, PA-C  ibuprofen (ADVIL) 800 MG tablet Take 1 tablet (800 mg total) by mouth 3 (three) times daily. 11/01/21   Carlisle Beers, FNP  linaclotide (LINZESS) 290 MCG CAPS capsule Take 290 mcg by mouth daily as needed (constipation).  06/28/16   [provider]  lisinopril-hydrochlorothiazide (PRINZIDE,ZESTORETIC) 20-12.5 MG tablet Take 1 tablet by mouth every morning. 07/06/16   [provider]  meclizine (ANTIVERT) 12.5 MG tablet Take 12.5-25 mg by mouth 3 (three) times daily as needed for dizziness.  05/19/16   [provider]  Multiple Vitamin (MULTIVITAMIN WITH MINERALS) TABS tablet Take 1 tablet  by mouth daily.    [provider]  predniSONE (DELTASONE) 50 MG tablet Take 1 tab daily with breakfast for 3 days 04/21/20   Particia Nearing, PA-C      Allergies    Patient has no known allergies.    Review of Systems   Review of Systems  Gastrointestinal:  Positive for abdominal pain.    Physical Exam Updated Vital Signs BP (!) 145/67   Pulse (!) 52   Temp 97.7 F (36.5 C) (Oral)   Resp 18   SpO2 95%  Physical Exam  ED Results / Procedures / Treatments   Labs (all labs ordered are listed, but only abnormal results are displayed) Labs Reviewed  CBC  LIPASE, BLOOD  COMPREHENSIVE METABOLIC PANEL    EKG None  Radiology No results found.  Procedures Procedures  {Document cardiac monitor, telemetry assessment procedure when appropriate:1}  Medications Ordered in ED Medications - No data to display  ED Course/ Medical Decision Making/ A&P   {   Click here for ABCD2, HEART and other calculatorsREFRESH Note before signing :1}                              Medical Decision Making Amount and/or Complexity of Data Reviewed Labs: ordered. Radiology: ordered.   *** Patient stating that she feels a lot better after being in the cold ED room.  {Document critical care time when appropriate:1} {Document review of labs and clinical decision tools ie heart score, Chads2Vasc2  etc:1}  {Document your independent review of radiology images, and any outside records:1} {Document your discussion with family members, caretakers, and with consultants:1} {Document social determinants of health affecting pt's care:1} {Document your decision making why or why not admission, treatments were needed:1} Final Clinical Impression(s) / ED Diagnoses Final diagnoses:  None    Rx / DC Orders ED Discharge Orders     None

## 2023-08-31 ENCOUNTER — Other Ambulatory Visit: Payer: Self-pay | Admitting: Family Medicine

## 2023-08-31 DIAGNOSIS — Z1231 Encounter for screening mammogram for malignant neoplasm of breast: Secondary | ICD-10-CM

## 2023-09-23 ENCOUNTER — Ambulatory Visit
Admission: RE | Admit: 2023-09-23 | Discharge: 2023-09-23 | Disposition: A | Discharge status: Home / Self Care | Source: Ambulatory Visit | Attending: Family Medicine

## 2023-09-23 ENCOUNTER — Ambulatory Visit

## 2023-09-23 DIAGNOSIS — Z1231 Encounter for screening mammogram for malignant neoplasm of breast: Secondary | ICD-10-CM

## 2023-11-24 ENCOUNTER — Encounter (HOSPITAL_BASED_OUTPATIENT_CLINIC_OR_DEPARTMENT_OTHER): Payer: Self-pay | Admitting: Emergency Medicine

## 2023-11-24 ENCOUNTER — Emergency Department (HOSPITAL_BASED_OUTPATIENT_CLINIC_OR_DEPARTMENT_OTHER)

## 2023-11-24 ENCOUNTER — Other Ambulatory Visit (HOSPITAL_BASED_OUTPATIENT_CLINIC_OR_DEPARTMENT_OTHER): Payer: Self-pay

## 2023-11-24 ENCOUNTER — Other Ambulatory Visit: Payer: Self-pay

## 2023-11-24 ENCOUNTER — Emergency Department (HOSPITAL_BASED_OUTPATIENT_CLINIC_OR_DEPARTMENT_OTHER)
Admission: EM | Admit: 2023-11-24 | Discharge: 2023-11-24 | Disposition: A | Discharge status: Home / Self Care | Attending: Emergency Medicine | Admitting: Emergency Medicine

## 2023-11-24 DIAGNOSIS — R1030 Lower abdominal pain, unspecified: Secondary | ICD-10-CM | POA: Diagnosis present

## 2023-11-24 DIAGNOSIS — Z8541 Personal history of malignant neoplasm of cervix uteri: Secondary | ICD-10-CM | POA: Insufficient documentation

## 2023-11-24 DIAGNOSIS — R112 Nausea with vomiting, unspecified: Secondary | ICD-10-CM | POA: Diagnosis not present

## 2023-11-24 DIAGNOSIS — Z79899 Other long term (current) drug therapy: Secondary | ICD-10-CM | POA: Insufficient documentation

## 2023-11-24 DIAGNOSIS — I10 Essential (primary) hypertension: Secondary | ICD-10-CM | POA: Insufficient documentation

## 2023-11-24 DIAGNOSIS — R1084 Generalized abdominal pain: Secondary | ICD-10-CM | POA: Insufficient documentation

## 2023-11-24 DIAGNOSIS — R197 Diarrhea, unspecified: Secondary | ICD-10-CM | POA: Diagnosis not present

## 2023-11-24 DIAGNOSIS — R109 Unspecified abdominal pain: Secondary | ICD-10-CM

## 2023-11-24 LAB — COMPREHENSIVE METABOLIC PANEL WITH GFR
ALT: 9 U/L (ref 0–44)
AST: 20 U/L (ref 15–41)
Albumin: 4.4 g/dL (ref 3.5–5.0)
Alkaline Phosphatase: 100 U/L (ref 38–126)
Anion gap: 13 (ref 5–15)
BUN: 14 mg/dL (ref 8–23)
CO2: 23 mmol/L (ref 22–32)
Calcium: 9.7 mg/dL (ref 8.9–10.3)
Chloride: 102 mmol/L (ref 98–111)
Creatinine, Ser: 0.62 mg/dL (ref 0.44–1.00)
GFR, Estimated: >60 mL/min (ref >60)
Glucose, Bld: 124 mg/dL — ABNORMAL HIGH (ref 70–99)
Potassium: 3.9 mmol/L (ref 3.5–5.1)
Sodium: 138 mmol/L (ref 135–145)
Total Bilirubin: 0.3 mg/dL (ref 0.0–1.2)
Total Protein: 7.7 g/dL (ref 6.5–8.1)

## 2023-11-24 LAB — URINALYSIS, ROUTINE W REFLEX MICROSCOPIC
Bacteria, UA: NONE SEEN
Bilirubin Urine: NEGATIVE
Glucose, UA: NEGATIVE mg/dL
Hgb urine dipstick: NEGATIVE
Ketones, ur: NEGATIVE mg/dL
Nitrite: NEGATIVE
Protein, ur: NEGATIVE mg/dL
Specific Gravity, Urine: 1.021 (ref 1.005–1.030)
pH: 5.5 (ref 5.0–8.0)

## 2023-11-24 LAB — CBC WITH DIFFERENTIAL/PLATELET
Abs Immature Granulocytes: 0.03 K/uL (ref 0.00–0.07)
Basophils Absolute: 0 K/uL (ref 0.0–0.1)
Basophils Relative: 0 %
Eosinophils Absolute: 0 K/uL (ref 0.0–0.5)
Eosinophils Relative: 0 %
HCT: 41.2 % (ref 36.0–46.0)
Hemoglobin: 13.3 g/dL (ref 12.0–15.0)
Immature Granulocytes: 0 %
Lymphocytes Relative: 18 %
Lymphs Abs: 1.3 K/uL (ref 0.7–4.0)
MCH: 29.6 pg (ref 26.0–34.0)
MCHC: 32.3 g/dL (ref 30.0–36.0)
MCV: 91.8 fL (ref 80.0–100.0)
Monocytes Absolute: 0.4 K/uL (ref 0.1–1.0)
Monocytes Relative: 5 %
Neutro Abs: 5.7 K/uL (ref 1.7–7.7)
Neutrophils Relative %: 77 %
Platelets: 353 K/uL (ref 150–400)
RBC: 4.49 MIL/uL (ref 3.87–5.11)
RDW: 13.7 % (ref 11.5–15.5)
WBC: 7.4 K/uL (ref 4.0–10.5)
nRBC: 0 % (ref 0.0–0.2)

## 2023-11-24 LAB — LIPASE, BLOOD: Lipase: 31 U/L (ref 11–51)

## 2023-11-24 MED ORDER — SODIUM CHLORIDE 0.9 % IV BOLUS
1000.0000 mL | Freq: Once | INTRAVENOUS | Status: AC
  Administered 2023-11-24: 1000 mL via INTRAVENOUS

## 2023-11-24 MED ORDER — ONDANSETRON HCL 4 MG/2ML IJ SOLN
4.0000 mg | Freq: Once | INTRAMUSCULAR | Status: AC
  Administered 2023-11-24: 4 mg via INTRAVENOUS
  Filled 2023-11-24: qty 2

## 2023-11-24 MED ORDER — IOHEXOL 300 MG/ML  SOLN
100.0000 mL | Freq: Once | INTRAMUSCULAR | Status: AC | PRN
  Administered 2023-11-24: 85 mL via INTRAVENOUS

## 2023-11-24 MED ORDER — ONDANSETRON 4 MG PO TBDP
4.0000 mg | ORAL_TABLET | Freq: Three times a day (TID) | ORAL | 0 refills | Status: AC | PRN
  Filled 2023-11-24: qty 20, 7d supply, fill #0

## 2023-11-24 NOTE — Discharge Instructions (Signed)
 Continue Zofran  as needed for nausea and vomiting.  If diarrhea is really persistent consider using Imodium over-the-counter.  Follow-up with your primary care doctor.

## 2023-11-24 NOTE — ED Notes (Signed)
 DC paperwork given and verbally understood.

## 2023-11-24 NOTE — ED Triage Notes (Signed)
 Pt c/o lower abd pain with n/v/d starting last night

## 2023-11-24 NOTE — ED Provider Notes (Signed)
 [place] [facility] AT [facility] Provider Note   CSN: [id number] Arrival date & time: 11/24/23  9046     Patient presents with: Abdominal Pain   Erika Mosley is a 73 y.o. female.   Patient here with nausea vomiting diarrhea.  Some abdominal cramping.  No suspicious food intake or viral symptoms or exposure otherwise.  Denies any fevers or chills.  History of hypertension high cholesterol.  History of cervical cancer.  She denies any chest pain shortness of breath weakness numbness tingling.  No recent antibiotic use.  No pain with urination.  The history is provided by the patient.       Prior to Admission medications   Medication Sig Start Date End Date Taking? Authorizing Provider  ondansetron  (ZOFRAN -ODT) 4 MG disintegrating tablet Take 1 tablet (4 mg total) by mouth every 8 (eight) hours as needed. 11/24/23  Yes Elya Tarquinio, DO  silver sulfADIAZINE (SILVADENE) 1 % cream Apply 1 Application topically 2 (two) times daily. 11/13/23 05/11/24 Yes [provider]  benzonatate  (TESSALON ) 100 MG capsule Take 1 capsule (100 mg total) by mouth 3 (three) times daily as needed for cough. 07/07/16   Dansie, William, PA-C  cetirizine  (ZYRTEC  ALLERGY) 10 MG tablet Take 1 tablet (10 mg total) by mouth daily. 04/21/20   Stuart Vernell Norris, PA-C  ferrous sulfate (FER-IN-SOL) 75 (15 FE) MG/ML SOLN Take 45 mg by mouth daily as needed (low iron).     [provider]  fluticasone  (FLONASE ) 50 MCG/ACT nasal spray Place 2 sprays into both nostrils in the morning and at bedtime. 04/21/20   Stuart Vernell Norris, PA-C  ibuprofen  (ADVIL ) 800 MG tablet Take 1 tablet (800 mg total) by mouth 3 (three) times daily. 11/01/21   Enedelia Dorna HERO, FNP  linaclotide (LINZESS) 290 MCG CAPS capsule Take 290 mcg by mouth daily as needed (constipation).  06/28/16   [provider]  lisinopril-hydrochlorothiazide (PRINZIDE,ZESTORETIC) 20-12.5 MG tablet Take 1 tablet  by mouth every morning. 07/06/16   [provider]  meclizine  (ANTIVERT ) 12.5 MG tablet Take 12.5-25 mg by mouth 3 (three) times daily as needed for dizziness.  05/19/16   [provider]  Multiple Vitamin (MULTIVITAMIN WITH MINERALS) TABS tablet Take 1 tablet by mouth daily.    [provider]  naproxen  (NAPROSYN ) 500 MG tablet Take 500 mg by mouth 2 (two) times daily.    [provider]  predniSONE  (DELTASONE ) 50 MG tablet Take 1 tab daily with breakfast for 3 days 04/21/20   Stuart Vernell Norris, PA-C    Allergies: Patient has no known allergies.    Review of Systems  Updated Vital Signs BP 106/62 (BP Location: Right Arm)   Pulse 60   Temp 97.8 F (36.6 C) (Oral)   Resp 16   Ht 5' 2.5 (1.588 m)   Wt 78.5 kg   SpO2 90%   BMI 31.14 kg/m   Physical Exam Vitals and nursing note reviewed.  Constitutional:      General: She is not in acute distress.    Appearance: She is well-developed. She is not ill-appearing.  HENT:     Head: Normocephalic and atraumatic.  Eyes:     Extraocular Movements: Extraocular movements intact.     Conjunctiva/sclera: Conjunctivae normal.     Pupils: Pupils are equal, round, and reactive to light.  Cardiovascular:     Rate and Rhythm: Normal rate and regular rhythm.     Heart sounds: Normal heart sounds. No murmur  heard. Pulmonary:     Effort: Pulmonary effort is normal. No respiratory distress.     Breath sounds: Normal breath sounds.  Abdominal:     General: Abdomen is flat.     Palpations: Abdomen is soft.     Tenderness: There is generalized abdominal tenderness.  Musculoskeletal:        General: No swelling.     Cervical back: Neck supple.  Skin:    General: Skin is warm and dry.     Capillary Refill: Capillary refill takes less than 2 seconds.  Neurological:     Mental Status: She is alert.  Psychiatric:        Mood and Affect: Mood normal.     (all labs ordered are listed, but only abnormal  results are displayed) Labs Reviewed  COMPREHENSIVE METABOLIC PANEL WITH GFR - Abnormal; Notable for the following components:      Result Value   Glucose, Bld 124 (*)    All other components within normal limits  URINALYSIS, ROUTINE W REFLEX MICROSCOPIC - Abnormal; Notable for the following components:   Leukocytes,Ua TRACE (*)    All other components within normal limits  CBC WITH DIFFERENTIAL/PLATELET  LIPASE, BLOOD    EKG: None  Radiology: CT ABDOMEN PELVIS W CONTRAST Result Date: 11/24/2023 EXAM: CT ABDOMEN AND PELVIS WITH CONTRAST 11/24/2023 12:27:17 PM TECHNIQUE: CT of the abdomen and pelvis was performed with the administration of 85mL of intravenous iohexol  (OMNIPAQUE ) 300 mg/mL solution. Multiplanar reformatted images are provided for review. Automated exposure control, iterative reconstruction, and/or weight-based adjustment of the mA/kV was utilized to reduce the radiation dose to as low as reasonably achievable. COMPARISON: CT of the abdomen and pelvis dated 11/01/2022. CLINICAL HISTORY: Abdominal pain, acute, nonlocalized. Pt c/o lower abd pain with n/v/d starting last night. FINDINGS: LOWER CHEST: Ground-glass and reticular opacities present dependently within the lower lobes bilaterally. LIVER: There is a round circumscribed hypodensity present within the dome of the right hepatic lobe, likely representing a cyst. An additional circumscribed hypodensity is present more inferiorly within the right hepatic lobe, which also likely represents a cyst. GALLBLADDER AND BILE DUCTS: There is a calcified stone within the gallbladder. SPLEEN: Normal size. No focal lesion. PANCREAS: No mass. No ductal dilatation. ADRENAL GLANDS: Normal appearance. No mass. KIDNEYS, URETERS AND BLADDER: No stones in the kidneys or ureters. No hydronephrosis. No perinephric or periureteral stranding. Urinary bladder is unremarkable. GI AND BOWEL: Few scattered colonic diverticula, but there is no evidence of  diverticulitis. The appendix is not clearly identified. There is no bowel obstruction. No bowel wall thickening. PERITONEUM AND RETROPERITONEUM: No ascites. No free air. VASCULATURE: Aorta is normal in caliber. LYMPH NODES: No lymphadenopathy. REPRODUCTIVE ORGANS: The patient is status post hysterectomy and bilateral salpingo-oophorectomy. BONES AND SOFT TISSUES: A round sclerotic density is demonstrated medially within the left ilium, likely representing a bone island. There is a small right inguinal fat-containing hernia. No acute osseous abnormality. IMPRESSION: 1. No acute findings in the abdomen or pelvis. 2. Calcified stone within the gallbladder. 3. Scattered colonic diverticula without evidence of diverticulitis. Electronically signed by: Evalene Coho MD 11/24/2023 12:44 PM EDT RP Workstation: [id number]     Procedures   Medications Ordered in the ED  sodium chloride  0.9 % bolus 1,000 mL (1,000 mLs Intravenous New Bag/Given 11/24/23 1206)  ondansetron  (ZOFRAN ) injection 4 mg (4 mg Intravenous Given 11/24/23 1204)  iohexol  (OMNIPAQUE ) 300 MG/ML solution 100 mL (85 mLs Intravenous Contrast Given 11/24/23 1218)  Medical Decision Making Amount and/or Complexity of Data Reviewed Labs: ordered. Radiology: ordered.  Risk Prescription drug management.   Lucendia GORMAN Das is here with nausea vomiting diarrhea.  History of hypertension high cholesterol cervical cancer.  Normal vitals.  No fever.  Differential diagnosis likely colitis versus viral illness versus flu or an illness versus less likely appendicitis bowel obstruction.  No recent antibiotic use.  Doubt C. difficile.  Symptoms only for 1 day.  Will give IV fluids IV Zofran  check basic labs get a CT scan abdomen pelvis and reevaluate.  Per my review and interpretation labs no significant leukocytosis anemia or electrolyte abnormality.  Urinalysis negative for infection.  CT scan with no acute findings.   She has a calcified gallbladder stone but she is not tender in this area.  Her pain is fully resolved.  Feeling much better.  Will prescribe Zofran  as needed.  I suspect viral process of foodborne illness.  Discharged in good condition.  Follow-up with primary care.  Return if symptoms worsen.  This chart was dictated using voice recognition software.  Despite best efforts to proofread,  errors can occur which can change the documentation meaning.      Final diagnoses:  Abdominal pain, unspecified abdominal location  Nausea vomiting and diarrhea    ED Discharge Orders          Ordered    ondansetron  (ZOFRAN -ODT) 4 MG disintegrating tablet  Every 8 hours PRN        11/24/23 1255               Naquita Nappier, DO 11/24/23 1256

## 2024-03-05 ENCOUNTER — Emergency Department (HOSPITAL_BASED_OUTPATIENT_CLINIC_OR_DEPARTMENT_OTHER): Admitting: Radiology

## 2024-03-05 ENCOUNTER — Encounter (HOSPITAL_BASED_OUTPATIENT_CLINIC_OR_DEPARTMENT_OTHER): Payer: Self-pay

## 2024-03-05 ENCOUNTER — Other Ambulatory Visit: Payer: Self-pay

## 2024-03-05 ENCOUNTER — Emergency Department (HOSPITAL_BASED_OUTPATIENT_CLINIC_OR_DEPARTMENT_OTHER)
Admission: EM | Admit: 2024-03-05 | Discharge: 2024-03-05 | Disposition: A | Discharge status: Home / Self Care | Attending: Emergency Medicine | Admitting: Emergency Medicine

## 2024-03-05 DIAGNOSIS — S46911A Strain of unspecified muscle, fascia and tendon at shoulder and upper arm level, right arm, initial encounter: Secondary | ICD-10-CM | POA: Insufficient documentation

## 2024-03-05 DIAGNOSIS — Y92480 Sidewalk as the place of occurrence of the external cause: Secondary | ICD-10-CM | POA: Insufficient documentation

## 2024-03-05 DIAGNOSIS — W010XXA Fall on same level from slipping, tripping and stumbling without subsequent striking against object, initial encounter: Secondary | ICD-10-CM | POA: Insufficient documentation

## 2024-03-05 MED ORDER — ACETAMINOPHEN 325 MG PO TABS: 650.0000 mg | ORAL_TABLET | Freq: Once | ORAL | Status: DC

## 2024-03-05 NOTE — Discharge Instructions (Signed)
 Follow up with Orthopedics for your shoulder. Continue to do range of motion exercises at home. Take Tylenol  for pain.  If you develop weakness or numbness in your arm or hand, uncontrolled pain, or any other new/concerning symptoms, then return to the ER.

## 2024-03-05 NOTE — ED Notes (Signed)
 DC paperwork given and verbally understood.

## 2024-03-05 NOTE — ED Provider Notes (Signed)
 [place] EMERGENCY DEPARTMENT AT [facility] Provider Note   CSN: [id number] Arrival date & time: 03/05/24  1425     Patient presents with: Shoulder Pain   Erika Mosley is a 73 y.o. female.   HPI 73 year old female presents with right shoulder pain.  She tripped and fell and landed on her right hand.  She has developed shoulder pain ever since with limited range of motion.  Her hand seems to be okay.  No head injury.  No weakness or numbness in the arm.  Prior to Admission medications   Medication Sig Start Date End Date Taking? Authorizing Provider  benzonatate  (TESSALON ) 100 MG capsule Take 1 capsule (100 mg total) by mouth 3 (three) times daily as needed for cough. 07/07/16   Dansie, William, PA-C  cetirizine  (ZYRTEC  ALLERGY) 10 MG tablet Take 1 tablet (10 mg total) by mouth daily. 04/21/20   Stuart Vernell Norris, PA-C  ferrous sulfate (FER-IN-SOL) 75 (15 FE) MG/ML SOLN Take 45 mg by mouth daily as needed (low iron).     [provider]  fluticasone  (FLONASE ) 50 MCG/ACT nasal spray Place 2 sprays into both nostrils in the morning and at bedtime. 04/21/20   Stuart Vernell Norris, PA-C  ibuprofen  (ADVIL ) 800 MG tablet Take 1 tablet (800 mg total) by mouth 3 (three) times daily. 11/01/21   Enedelia Dorna HERO, FNP  linaclotide (LINZESS) 290 MCG CAPS capsule Take 290 mcg by mouth daily as needed (constipation).  06/28/16   [provider]  lisinopril-hydrochlorothiazide (PRINZIDE,ZESTORETIC) 20-12.5 MG tablet Take 1 tablet by mouth every morning. 07/06/16   [provider]  meclizine  (ANTIVERT ) 12.5 MG tablet Take 12.5-25 mg by mouth 3 (three) times daily as needed for dizziness.  05/19/16   [provider]  Multiple Vitamin (MULTIVITAMIN WITH MINERALS) TABS tablet Take 1 tablet by mouth daily.    [provider]  naproxen  (NAPROSYN ) 500 MG tablet Take 500 mg by mouth 2 (two) times daily.    [provider]  ondansetron   (ZOFRAN -ODT) 4 MG disintegrating tablet Take 1 tablet (4 mg total) by mouth every 8 (eight) hours as needed. 11/24/23   Ruthe Cornet, DO  predniSONE  (DELTASONE ) 50 MG tablet Take 1 tab daily with breakfast for 3 days 04/21/20   Stuart Vernell Norris, PA-C  silver sulfADIAZINE (SILVADENE) 1 % cream Apply 1 Application topically 2 (two) times daily. 11/13/23 05/11/24  [provider]    Allergies: Patient has no known allergies.    Review of Systems  Musculoskeletal:  Positive for arthralgias.  Neurological:  Negative for weakness, numbness and headaches.    Updated Vital Signs BP 132/64 (BP Location: Left Arm)   Pulse 87   Temp 97.8 F (36.6 C)   Resp 16   Ht 5' 2 (1.575 m)   Wt 77.1 kg   SpO2 99%   BMI 31.09 kg/m   Physical Exam Vitals and nursing note reviewed.  Constitutional:      Appearance: She is well-developed.  HENT:     Head: Normocephalic and atraumatic.  Cardiovascular:     Rate and Rhythm: Normal rate and regular rhythm.     Pulses:          Radial pulses are 2+ on the right side.  Pulmonary:     Effort: Pulmonary effort is normal.  Musculoskeletal:     Right shoulder: Tenderness present. No swelling or deformity. Decreased range of motion.     Right elbow: Normal range of motion. No  tenderness.     Comments: No tenderness or swelling below the shoulder including hand, wrist, forearm.  Skin:    General: Skin is warm and dry.  Neurological:     Mental Status: She is alert.     (all labs ordered are listed, but only abnormal results are displayed) Labs Reviewed - No data to display  EKG: None  Radiology: DG Shoulder Right Result Date: 03/05/2024 CLINICAL DATA:  Fall with pain. EXAM: RIGHT SHOULDER - 2+ VIEW COMPARISON:  None Available. FINDINGS: No acute osseous or joint abnormality. Mild degenerative changes of the right acromioclavicular joint. Visualized right chest is grossly unremarkable. IMPRESSION: No acute findings. Electronically  Signed   By: Newell Eke M.D.   On: 03/05/2024 15:24     Procedures   Medications Ordered in the ED  acetaminophen  (TYLENOL ) tablet 650 mg (has no administration in time range)                                    Medical Decision Making Amount and/or Complexity of Data Reviewed Radiology: ordered and independent interpretation performed.    Details: No fracture  Risk OTC drugs.   Patient presents with a fall and shoulder pain for the last few days.  Some limited range of motion.  No erythema to suggest infection.  Seems to be a sprain versus strain.  X-ray negative.  Will recommend OTC meds for pain, ice, and follow-up with orthopedics.  Sounds like she has seen [facility] in the past, will refer to them.  Discussed continued to try to use range of motion at home to help prevent a frozen shoulder.  Will give return precautions.     Final diagnoses:  Strain of right shoulder, initial encounter    ED Discharge Orders     None          Freddi Hamilton, MD 03/05/24 1556

## 2024-03-05 NOTE — ED Triage Notes (Signed)
 Tripped and fell over side walk curb on Friday. Right shoulder pain.
